# Patient Record
Sex: Male | Born: 1970 | Race: White | Hispanic: No | Marital: Married | Smoking: Current every day smoker
Health system: Southern US, Community
[De-identification: ages and names within clinical notes are randomized; demographics above are authoritative.]

## PROBLEM LIST (undated history)

## (undated) DIAGNOSIS — E785 Hyperlipidemia, unspecified: Secondary | ICD-10-CM

## (undated) DIAGNOSIS — I10 Essential (primary) hypertension: Secondary | ICD-10-CM

## (undated) HISTORY — DX: Hyperlipidemia, unspecified: E78.5

## (undated) HISTORY — DX: Essential (primary) hypertension: I10

---

## 2020-06-04 ENCOUNTER — Emergency Department (HOSPITAL_COMMUNITY): Payer: Self-pay | Admitting: Anesthesiology

## 2020-06-04 ENCOUNTER — Encounter (HOSPITAL_COMMUNITY): Admission: EM | Disposition: A | Discharge status: Home / Self Care | Payer: Self-pay | Source: Home / Self Care

## 2020-06-04 ENCOUNTER — Inpatient Hospital Stay (HOSPITAL_COMMUNITY)
Admission: EM | Admit: 2020-06-04 | Discharge: 2020-06-12 | DRG: 799 | Disposition: A | Discharge status: Home / Self Care | Payer: Self-pay | Attending: Surgery | Admitting: Surgery

## 2020-06-04 ENCOUNTER — Emergency Department (HOSPITAL_COMMUNITY): Payer: Self-pay

## 2020-06-04 ENCOUNTER — Other Ambulatory Visit: Payer: Self-pay

## 2020-06-04 ENCOUNTER — Encounter (HOSPITAL_COMMUNITY): Payer: Self-pay | Admitting: *Deleted

## 2020-06-04 DIAGNOSIS — Z711 Person with feared health complaint in whom no diagnosis is made: Secondary | ICD-10-CM

## 2020-06-04 DIAGNOSIS — S3609XA Other injury of spleen, initial encounter: Principal | ICD-10-CM | POA: Diagnosis present

## 2020-06-04 DIAGNOSIS — I96 Gangrene, not elsewhere classified: Secondary | ICD-10-CM | POA: Diagnosis present

## 2020-06-04 DIAGNOSIS — Z681 Body mass index (BMI) 19 or less, adult: Secondary | ICD-10-CM

## 2020-06-04 DIAGNOSIS — R000 Tachycardia, unspecified: Secondary | ICD-10-CM | POA: Diagnosis present

## 2020-06-04 DIAGNOSIS — W1789XA Other fall from one level to another, initial encounter: Secondary | ICD-10-CM | POA: Diagnosis present

## 2020-06-04 DIAGNOSIS — J449 Chronic obstructive pulmonary disease, unspecified: Secondary | ICD-10-CM | POA: Diagnosis present

## 2020-06-04 DIAGNOSIS — Z978 Presence of other specified devices: Secondary | ICD-10-CM

## 2020-06-04 DIAGNOSIS — R64 Cachexia: Secondary | ICD-10-CM | POA: Diagnosis present

## 2020-06-04 DIAGNOSIS — E872 Acidosis: Secondary | ICD-10-CM | POA: Diagnosis present

## 2020-06-04 DIAGNOSIS — D62 Acute posthemorrhagic anemia: Secondary | ICD-10-CM | POA: Diagnosis present

## 2020-06-04 DIAGNOSIS — K661 Hemoperitoneum: Secondary | ICD-10-CM

## 2020-06-04 DIAGNOSIS — K869 Disease of pancreas, unspecified: Secondary | ICD-10-CM | POA: Diagnosis present

## 2020-06-04 DIAGNOSIS — Z20822 Contact with and (suspected) exposure to covid-19: Secondary | ICD-10-CM | POA: Diagnosis present

## 2020-06-04 DIAGNOSIS — E43 Unspecified severe protein-calorie malnutrition: Secondary | ICD-10-CM | POA: Insufficient documentation

## 2020-06-04 DIAGNOSIS — Z903 Acquired absence of stomach [part of]: Secondary | ICD-10-CM

## 2020-06-04 DIAGNOSIS — J95821 Acute postprocedural respiratory failure: Secondary | ICD-10-CM | POA: Diagnosis not present

## 2020-06-04 DIAGNOSIS — Z9889 Other specified postprocedural states: Secondary | ICD-10-CM

## 2020-06-04 DIAGNOSIS — D72829 Elevated white blood cell count, unspecified: Secondary | ICD-10-CM | POA: Diagnosis present

## 2020-06-04 DIAGNOSIS — R1110 Vomiting, unspecified: Secondary | ICD-10-CM | POA: Diagnosis not present

## 2020-06-04 DIAGNOSIS — F17210 Nicotine dependence, cigarettes, uncomplicated: Secondary | ICD-10-CM | POA: Diagnosis present

## 2020-06-04 DIAGNOSIS — R0902 Hypoxemia: Secondary | ICD-10-CM

## 2020-06-04 DIAGNOSIS — K5900 Constipation, unspecified: Secondary | ICD-10-CM | POA: Diagnosis present

## 2020-06-04 DIAGNOSIS — S36039A Unspecified laceration of spleen, initial encounter: Secondary | ICD-10-CM

## 2020-06-04 DIAGNOSIS — R066 Hiccough: Secondary | ICD-10-CM | POA: Diagnosis not present

## 2020-06-04 HISTORY — PX: SPLENECTOMY, TOTAL: SHX788

## 2020-06-04 LAB — POCT I-STAT, CHEM 8
BUN: 20 mg/dL (ref 6–20)
Calcium, Ion: 1.15 mmol/L (ref 1.15–1.40)
Chloride: 99 mmol/L (ref 98–111)
Creatinine, Ser: 1.1 mg/dL (ref 0.61–1.24)
Glucose, Bld: 160 mg/dL — ABNORMAL HIGH (ref 70–99)
HCT: 27 % — ABNORMAL LOW (ref 39.0–52.0)
Hemoglobin: 9.2 g/dL — ABNORMAL LOW (ref 13.0–17.0)
Potassium: 5.5 mmol/L — ABNORMAL HIGH (ref 3.5–5.1)
Sodium: 134 mmol/L — ABNORMAL LOW (ref 135–145)
TCO2: 24 mmol/L (ref 22–32)

## 2020-06-04 LAB — LACTIC ACID, PLASMA: Lactic Acid, Venous: 7.3 mmol/L (ref 0.5–1.9)

## 2020-06-04 LAB — CBC WITH DIFFERENTIAL/PLATELET
Abs Immature Granulocytes: 0.95 10*3/uL — ABNORMAL HIGH (ref 0.00–0.07)
Abs Immature Granulocytes: 0.63 10*3/uL — ABNORMAL HIGH (ref 0.00–0.07)
Basophils Absolute: 0.1 10*3/uL (ref 0.0–0.1)
Basophils Absolute: 0.1 10*3/uL (ref 0.0–0.1)
Basophils Relative: 1 %
Basophils Relative: 0 %
Eosinophils Absolute: 0 10*3/uL (ref 0.0–0.5)
Eosinophils Absolute: 0 10*3/uL (ref 0.0–0.5)
Eosinophils Relative: 0 %
Eosinophils Relative: 0 %
HCT: 35.3 % — ABNORMAL LOW (ref 39.0–52.0)
HCT: 26.2 % — ABNORMAL LOW (ref 39.0–52.0)
Hemoglobin: 11 g/dL — ABNORMAL LOW (ref 13.0–17.0)
Hemoglobin: 8.3 g/dL — ABNORMAL LOW (ref 13.0–17.0)
Immature Granulocytes: 5 %
Immature Granulocytes: 4 %
Lymphocytes Relative: 9 %
Lymphocytes Relative: 8 %
Lymphs Abs: 1.7 10*3/uL (ref 0.7–4.0)
Lymphs Abs: 1.3 10*3/uL (ref 0.7–4.0)
MCH: 34 pg (ref 26.0–34.0)
MCH: 33.7 pg (ref 26.0–34.0)
MCHC: 31.2 g/dL (ref 30.0–36.0)
MCHC: 31.7 g/dL (ref 30.0–36.0)
MCV: 109 fL — ABNORMAL HIGH (ref 80.0–100.0)
MCV: 106.5 fL — ABNORMAL HIGH (ref 80.0–100.0)
Monocytes Absolute: 1.5 10*3/uL — ABNORMAL HIGH (ref 0.1–1.0)
Monocytes Absolute: 1.5 10*3/uL — ABNORMAL HIGH (ref 0.1–1.0)
Monocytes Relative: 8 %
Monocytes Relative: 9 %
Neutro Abs: 15.3 10*3/uL — ABNORMAL HIGH (ref 1.7–7.7)
Neutro Abs: 12.3 10*3/uL — ABNORMAL HIGH (ref 1.7–7.7)
Neutrophils Relative %: 77 %
Neutrophils Relative %: 79 %
Platelets: 390 10*3/uL (ref 150–400)
Platelets: 365 10*3/uL (ref 150–400)
RBC: 3.24 MIL/uL — ABNORMAL LOW (ref 4.22–5.81)
RBC: 2.46 MIL/uL — ABNORMAL LOW (ref 4.22–5.81)
RDW: 13.1 % (ref 11.5–15.5)
RDW: 13.1 % (ref 11.5–15.5)
WBC: 19.6 10*3/uL — ABNORMAL HIGH (ref 4.0–10.5)
WBC: 15.7 10*3/uL — ABNORMAL HIGH (ref 4.0–10.5)
nRBC: 0 % (ref 0.0–0.2)
nRBC: 0 % (ref 0.0–0.2)

## 2020-06-04 LAB — COMPREHENSIVE METABOLIC PANEL
ALT: 25 U/L (ref 0–44)
AST: 24 U/L (ref 15–41)
Albumin: 2.9 g/dL — ABNORMAL LOW (ref 3.5–5.0)
Alkaline Phosphatase: 87 U/L (ref 38–126)
Anion gap: >20 — ABNORMAL HIGH (ref 5–15)
BUN: 16 mg/dL (ref 6–20)
CO2: 16 mmol/L — ABNORMAL LOW (ref 22–32)
Calcium: 8.9 mg/dL (ref 8.9–10.3)
Chloride: 97 mmol/L — ABNORMAL LOW (ref 98–111)
Creatinine, Ser: 2.13 mg/dL — ABNORMAL HIGH (ref 0.61–1.24)
GFR, Estimated: 37 mL/min — ABNORMAL LOW (ref >60)
Glucose, Bld: 232 mg/dL — ABNORMAL HIGH (ref 70–99)
Potassium: 4 mmol/L (ref 3.5–5.1)
Sodium: 134 mmol/L — ABNORMAL LOW (ref 135–145)
Total Bilirubin: 0.5 mg/dL (ref 0.3–1.2)
Total Protein: 6.7 g/dL (ref 6.5–8.1)

## 2020-06-04 LAB — RESP PANEL BY RT-PCR (FLU A&B, COVID) ARPGX2
Influenza A by PCR: NEGATIVE
Influenza B by PCR: NEGATIVE
SARS Coronavirus 2 by RT PCR: NEGATIVE

## 2020-06-04 LAB — POCT I-STAT EG7
Acid-base deficit: 6 mmol/L — ABNORMAL HIGH (ref 0.0–2.0)
Bicarbonate: 22.4 mmol/L (ref 20.0–28.0)
Calcium, Ion: 1.13 mmol/L — ABNORMAL LOW (ref 1.15–1.40)
HCT: 33 % — ABNORMAL LOW (ref 39.0–52.0)
Hemoglobin: 11.2 g/dL — ABNORMAL LOW (ref 13.0–17.0)
O2 Saturation: 27 %
Potassium: 5 mmol/L (ref 3.5–5.1)
Sodium: 138 mmol/L (ref 135–145)
TCO2: 24 mmol/L (ref 22–32)
pCO2, Ven: 55.6 mmHg (ref 44.0–60.0)
pH, Ven: 7.214 — ABNORMAL LOW (ref 7.250–7.430)
pO2, Ven: 22 mmHg — CL (ref 32.0–45.0)

## 2020-06-04 LAB — PREPARE RBC (CROSSMATCH)

## 2020-06-04 LAB — POC OCCULT BLOOD, ED: Fecal Occult Bld: NEGATIVE

## 2020-06-04 LAB — APTT: aPTT: 24 seconds (ref 24–36)

## 2020-06-04 LAB — PROTIME-INR
INR: 1.3 — ABNORMAL HIGH (ref 0.8–1.2)
Prothrombin Time: 15.3 seconds — ABNORMAL HIGH (ref 11.4–15.2)

## 2020-06-04 SURGERY — SPLENECTOMY
Anesthesia: General | Site: Abdomen

## 2020-06-04 MED ORDER — PROPOFOL 500 MG/50ML IV EMUL
INTRAVENOUS | Status: DC | PRN
  Administered 2020-06-04: 50 ug/kg/min via INTRAVENOUS

## 2020-06-04 MED ORDER — SODIUM CHLORIDE 0.9 % IV SOLN: INTRAVENOUS | Status: DC | PRN

## 2020-06-04 MED ORDER — ONDANSETRON HCL 4 MG/2ML IJ SOLN: 4.0000 mg | Freq: Four times a day (QID) | INTRAMUSCULAR | Status: DC | PRN

## 2020-06-04 MED ORDER — LACTATED RINGERS IV SOLN: INTRAVENOUS | Status: DC | PRN

## 2020-06-04 MED ORDER — FENTANYL CITRATE (PF) 250 MCG/5ML IJ SOLN
INTRAMUSCULAR | Status: AC
  Filled 2020-06-04: qty 5

## 2020-06-04 MED ORDER — LACTATED RINGERS IV BOLUS (SEPSIS)
1000.0000 mL | Freq: Once | INTRAVENOUS | Status: AC
  Administered 2020-06-04: 15:00:00 1000 mL via INTRAVENOUS

## 2020-06-04 MED ORDER — CHLORHEXIDINE GLUCONATE 0.12% ORAL RINSE (MEDLINE KIT)
15.0000 mL | Freq: Two times a day (BID) | OROMUCOSAL | Status: DC
  Administered 2020-06-05: 15 mL via OROMUCOSAL

## 2020-06-04 MED ORDER — PROCHLORPERAZINE MALEATE 10 MG PO TABS
10.0000 mg | ORAL_TABLET | Freq: Four times a day (QID) | ORAL | Status: DC | PRN
  Filled 2020-06-04: qty 1

## 2020-06-04 MED ORDER — MIDAZOLAM HCL 2 MG/2ML IJ SOLN: 2.0000 mg | INTRAMUSCULAR | Status: DC | PRN

## 2020-06-04 MED ORDER — CEFAZOLIN SODIUM-DEXTROSE 2-3 GM-%(50ML) IV SOLR
INTRAVENOUS | Status: DC | PRN
  Administered 2020-06-04: 2 g via INTRAVENOUS

## 2020-06-04 MED ORDER — FENTANYL CITRATE (PF) 100 MCG/2ML IJ SOLN
50.0000 ug | INTRAMUSCULAR | Status: DC | PRN
  Administered 2020-06-05 (×2): 50 ug via INTRAVENOUS
  Filled 2020-06-04 (×2): qty 2

## 2020-06-04 MED ORDER — PIPERACILLIN-TAZOBACTAM 3.375 G IVPB 30 MIN
3.3750 g | Freq: Once | INTRAVENOUS | Status: AC
  Administered 2020-06-04: 15:00:00 3.375 g via INTRAVENOUS
  Filled 2020-06-04: qty 50

## 2020-06-04 MED ORDER — FENTANYL CITRATE (PF) 100 MCG/2ML IJ SOLN: 50.0000 ug | INTRAMUSCULAR | Status: DC | PRN

## 2020-06-04 MED ORDER — MIDAZOLAM HCL 2 MG/2ML IJ SOLN
INTRAMUSCULAR | Status: AC
  Filled 2020-06-04: qty 2

## 2020-06-04 MED ORDER — PROCHLORPERAZINE EDISYLATE 10 MG/2ML IJ SOLN
5.0000 mg | Freq: Four times a day (QID) | INTRAMUSCULAR | Status: DC | PRN
  Filled 2020-06-04: qty 2

## 2020-06-04 MED ORDER — ONDANSETRON HCL 4 MG/2ML IJ SOLN
INTRAMUSCULAR | Status: DC | PRN
  Administered 2020-06-04: 4 mg via INTRAVENOUS

## 2020-06-04 MED ORDER — LACTATED RINGERS IV SOLN: INTRAVENOUS | Status: DC

## 2020-06-04 MED ORDER — SODIUM BICARBONATE 8.4 % IV SOLN
INTRAVENOUS | Status: DC | PRN
  Administered 2020-06-04: 50 meq via INTRAVENOUS

## 2020-06-04 MED ORDER — PIPERACILLIN-TAZOBACTAM 3.375 G IVPB
3.3750 g | Freq: Three times a day (TID) | INTRAVENOUS | Status: DC
  Administered 2020-06-05: 05:00:00 3.375 g via INTRAVENOUS
  Filled 2020-06-04: qty 50

## 2020-06-04 MED ORDER — ALBUMIN HUMAN 5 % IV SOLN: INTRAVENOUS | Status: DC | PRN

## 2020-06-04 MED ORDER — ROCURONIUM BROMIDE 10 MG/ML (PF) SYRINGE
PREFILLED_SYRINGE | INTRAVENOUS | Status: DC | PRN
  Administered 2020-06-04 (×2): 10 mg via INTRAVENOUS
  Administered 2020-06-04: 20 mg via INTRAVENOUS
  Administered 2020-06-04: 40 mg via INTRAVENOUS
  Administered 2020-06-04: 10 mg via INTRAVENOUS

## 2020-06-04 MED ORDER — SODIUM CHLORIDE 0.9 % IV SOLN: 2.0000 g | INTRAVENOUS | Status: DC

## 2020-06-04 MED ORDER — IOHEXOL 300 MG/ML  SOLN
75.0000 mL | Freq: Once | INTRAMUSCULAR | Status: AC | PRN
  Administered 2020-06-04: 16:00:00 75 mL via INTRAVENOUS

## 2020-06-04 MED ORDER — FENTANYL CITRATE (PF) 100 MCG/2ML IJ SOLN
50.0000 ug | Freq: Once | INTRAMUSCULAR | Status: AC
  Administered 2020-06-04: 17:00:00 50 ug via INTRAVENOUS
  Filled 2020-06-04: qty 2

## 2020-06-04 MED ORDER — FENTANYL CITRATE (PF) 100 MCG/2ML IJ SOLN
INTRAMUSCULAR | Status: DC | PRN
  Administered 2020-06-04: 25 ug via INTRAVENOUS
  Administered 2020-06-04: 50 ug via INTRAVENOUS
  Administered 2020-06-04: 100 ug via INTRAVENOUS
  Administered 2020-06-04: 50 ug via INTRAVENOUS
  Administered 2020-06-04 (×3): 25 ug via INTRAVENOUS

## 2020-06-04 MED ORDER — SODIUM CHLORIDE 0.9 % IV SOLN: 250.0000 mL | INTRAVENOUS | Status: DC

## 2020-06-04 MED ORDER — LIDOCAINE 2% (20 MG/ML) 5 ML SYRINGE: INTRAMUSCULAR | Status: DC | PRN

## 2020-06-04 MED ORDER — PHENYLEPHRINE HCL-NACL 10-0.9 MG/250ML-% IV SOLN
INTRAVENOUS | Status: DC | PRN
  Administered 2020-06-04: 50 ug/min via INTRAVENOUS

## 2020-06-04 MED ORDER — ETOMIDATE 2 MG/ML IV SOLN
INTRAVENOUS | Status: DC | PRN
  Administered 2020-06-04: 12 mg via INTRAVENOUS

## 2020-06-04 MED ORDER — 0.9 % SODIUM CHLORIDE (POUR BTL) OPTIME
TOPICAL | Status: DC | PRN
  Administered 2020-06-04: 20:00:00 1000 mL

## 2020-06-04 MED ORDER — ORAL CARE MOUTH RINSE: 15.0000 mL | OROMUCOSAL | Status: DC

## 2020-06-04 MED ORDER — METRONIDAZOLE IN NACL 5-0.79 MG/ML-% IV SOLN
500.0000 mg | INTRAVENOUS | Status: AC
  Administered 2020-06-04: 20:00:00 500 mg via INTRAVENOUS
  Filled 2020-06-04: qty 100

## 2020-06-04 MED ORDER — ONDANSETRON HCL 4 MG/2ML IJ SOLN
4.0000 mg | Freq: Once | INTRAMUSCULAR | Status: AC
  Administered 2020-06-04: 15:00:00 4 mg via INTRAVENOUS
  Filled 2020-06-04: qty 2

## 2020-06-04 MED ORDER — LACTATED RINGERS IV BOLUS (SEPSIS)
1000.0000 mL | Freq: Once | INTRAVENOUS | Status: AC
Start: 2020-06-04 — End: 2020-06-04
  Administered 2020-06-04: 16:00:00 1000 mL via INTRAVENOUS

## 2020-06-04 MED ORDER — SUCCINYLCHOLINE CHLORIDE 20 MG/ML IJ SOLN
INTRAMUSCULAR | Status: DC | PRN
  Administered 2020-06-04: 140 mg via INTRAVENOUS

## 2020-06-04 MED ORDER — PROPOFOL 1000 MG/100ML IV EMUL
0.0000 ug/kg/min | INTRAVENOUS | Status: DC
  Administered 2020-06-04: 22:00:00 25 ug/kg/min via INTRAVENOUS
  Administered 2020-06-05: 04:00:00 40 ug/kg/min via INTRAVENOUS
  Filled 2020-06-04: qty 100

## 2020-06-04 MED ORDER — CALCIUM CHLORIDE 10 % IV SOLN
INTRAVENOUS | Status: DC | PRN
  Administered 2020-06-04: 200 mg via INTRAVENOUS
  Administered 2020-06-04: 100 mg via INTRAVENOUS

## 2020-06-04 MED ORDER — MIDAZOLAM HCL 5 MG/5ML IJ SOLN
INTRAMUSCULAR | Status: DC | PRN
  Administered 2020-06-04 (×2): .5 mg via INTRAVENOUS
  Administered 2020-06-04: 1 mg via INTRAVENOUS

## 2020-06-04 MED ORDER — ONDANSETRON 4 MG PO TBDP
4.0000 mg | ORAL_TABLET | Freq: Four times a day (QID) | ORAL | Status: DC | PRN
  Filled 2020-06-04: qty 1

## 2020-06-04 MED ORDER — PHENYLEPHRINE HCL-NACL 10-0.9 MG/250ML-% IV SOLN
25.0000 ug/min | INTRAVENOUS | Status: DC
  Administered 2020-06-04: 22:00:00 75 ug/min via INTRAVENOUS
  Administered 2020-06-05: 04:00:00 65 ug/min via INTRAVENOUS
  Administered 2020-06-05: 02:00:00 50 ug/min via INTRAVENOUS
  Filled 2020-06-04 (×3): qty 250

## 2020-06-04 MED ORDER — DEXAMETHASONE SODIUM PHOSPHATE 10 MG/ML IJ SOLN
INTRAMUSCULAR | Status: DC | PRN
  Administered 2020-06-04: 5 mg via INTRAVENOUS

## 2020-06-04 MED ORDER — CHLORHEXIDINE GLUCONATE CLOTH 2 % EX PADS
6.0000 | MEDICATED_PAD | Freq: Every day | CUTANEOUS | Status: DC
  Administered 2020-06-06 – 2020-06-12 (×7): 6 via TOPICAL

## 2020-06-04 SURGICAL SUPPLY — 55 items
APPLIER CLIP 11 MED OPEN (CLIP) ×2
BIOPATCH RED 1 DISK 7.0 (GAUZE/BANDAGES/DRESSINGS) ×4 IMPLANT
CHLORAPREP W/TINT 26 (MISCELLANEOUS) ×2 IMPLANT
CLIP APPLIE 11 MED OPEN (CLIP) ×1 IMPLANT
COVER SURGICAL LIGHT HANDLE (MISCELLANEOUS) ×2 IMPLANT
DRAIN CHANNEL 19F RND (DRAIN) ×2 IMPLANT
DRAIN PENROSE 0.5X18 (DRAIN) ×2 IMPLANT
DRAPE LAPAROSCOPIC ABDOMINAL (DRAPES) ×2 IMPLANT
DRSG OPSITE POSTOP 4X12 (GAUZE/BANDAGES/DRESSINGS) ×4 IMPLANT
ELECT BLADE 6.5 EXT (BLADE) ×2 IMPLANT
ELECT REM PT RETURN 9FT ADLT (ELECTROSURGICAL) ×2
ELECTRODE REM PT RTRN 9FT ADLT (ELECTROSURGICAL) ×1 IMPLANT
EVACUATOR SILICONE 100CC (DRAIN) ×2 IMPLANT
GAUZE SPONGE 4X4 12PLY STRL LF (GAUZE/BANDAGES/DRESSINGS) ×2 IMPLANT
GLOVE BIO SURGEON STRL SZ7 (GLOVE) ×2 IMPLANT
GLOVE BIO SURGEON STRL SZ8 (GLOVE) ×4 IMPLANT
GLOVE BIOGEL PI IND STRL 8 (GLOVE) ×2 IMPLANT
GLOVE BIOGEL PI INDICATOR 8 (GLOVE) ×2
GOWN STRL REUS W/ TWL LRG LVL3 (GOWN DISPOSABLE) ×2 IMPLANT
GOWN STRL REUS W/ TWL XL LVL3 (GOWN DISPOSABLE) ×1 IMPLANT
GOWN STRL REUS W/TWL LRG LVL3 (GOWN DISPOSABLE) ×2
GOWN STRL REUS W/TWL XL LVL3 (GOWN DISPOSABLE) ×1
HANDLE SUCTION POOLE (INSTRUMENTS) ×1 IMPLANT
KIT BASIN OR (CUSTOM PROCEDURE TRAY) ×2 IMPLANT
KIT TURNOVER KIT B (KITS) ×2 IMPLANT
LIGASURE IMPACT 36 18CM CVD LR (INSTRUMENTS) ×2 IMPLANT
NS IRRIG 1000ML POUR BTL (IV SOLUTION) ×4 IMPLANT
PACK GENERAL/GYN (CUSTOM PROCEDURE TRAY) ×2 IMPLANT
PAD ARMBOARD 7.5X6 YLW CONV (MISCELLANEOUS) ×4 IMPLANT
PENCIL SMOKE EVACUATOR (MISCELLANEOUS) ×2 IMPLANT
RELOAD STAPLER BLUE 60MM (STAPLE) ×2 IMPLANT
RELOAD STAPLER GREEN 60MM (STAPLE) ×7 IMPLANT
RELOAD STAPLER WHITE 60MM (STAPLE) ×1 IMPLANT
SLEEVE SURGEON STRL (DRAPES) ×2 IMPLANT
SPONGE LAP 18X18 RF (DISPOSABLE) ×12 IMPLANT
STAPLE ECHEON FLEX 60 POW ENDO (STAPLE) ×2 IMPLANT
STAPLER RELOAD BLUE 60MM (STAPLE) ×4
STAPLER RELOAD GREEN 60MM (STAPLE) ×14
STAPLER RELOAD WHITE 60MM (STAPLE) ×2
STAPLER VISISTAT 35W (STAPLE) ×2 IMPLANT
SUCTION POOLE HANDLE (INSTRUMENTS) ×2
SUT ETHILON 2 0 FS 18 (SUTURE) ×2 IMPLANT
SUT PDS AB 1 TP1 96 (SUTURE) ×4 IMPLANT
SUT PDS AB 2-0 CT1 27 (SUTURE) ×2 IMPLANT
SUT SILK 0 TIES 10X30 (SUTURE) ×2 IMPLANT
SUT SILK 2 0 SH CR/8 (SUTURE) ×4 IMPLANT
SUT SILK 2 0 TIES 10X30 (SUTURE) ×2 IMPLANT
SUT SILK 3 0 SH CR/8 (SUTURE) ×2 IMPLANT
SUT VIC AB 2-0 SH 18 (SUTURE) ×4 IMPLANT
SYR CONTROL 10ML LL (SYRINGE) ×2 IMPLANT
TOWEL GREEN STERILE (TOWEL DISPOSABLE) ×2 IMPLANT
TOWEL GREEN STERILE FF (TOWEL DISPOSABLE) ×2 IMPLANT
TRAY FOL W/BAG SLVR 16FR STRL (SET/KITS/TRAYS/PACK) ×1 IMPLANT
TRAY FOLEY W/BAG SLVR 16FR LF (SET/KITS/TRAYS/PACK) ×1
TUBE MIC GASTROSTOMY 18FR 7-10 (TUBING) ×2 IMPLANT

## 2020-06-04 NOTE — ED Notes (Signed)
Pt gone to CT 

## 2020-06-04 NOTE — ED Provider Notes (Signed)
Medical screening examination/treatment/procedure(s) were conducted as a shared visit with non-physician practitioner(s) and myself.  I personally evaluated the patient during the encounter.  Clinical Impression:   Final diagnoses:  Intraperitoneal hemorrhage  Laceration of spleen, initial encounter    This patient is an ill-appearing 49 year old male presenting after having acute onset of abdominal pain yesterday morning.  He does recall having a fall a couple of days ago the exact details of which are somewhat cloudy however he states that the dog ran under his feet when he was coming down the stairs he tripped and fell thinks that he landed on his right side.  On exam the patient is a peritoneal abdomen, he is guarding diffusely, he has mild tachycardia, mild hypotension, his x-ray of his lungs show that he has a large bleb in the left side encompassing more than a third of his left thoracic cavity, he is a chronic smoker.  His CT scan of abdomen pelvis which I personally viewed shows free fluid, after discussion with the radiologist it appears that there is a large splenic injury or rupture with free abdominal blood.  I discussed the case with Dr. Franky Macho our local surgeon who agrees that this patient may need more blood transfusions or stabilizing care and with his lung disease may need further expertise that may be better obtained at a larger trauma center.  Will discuss with the trauma surgeons.  This patient is critically ill.  Will repeat hemoglobin.  Start 2 units of blood, discussed with the trauma surgeon at [facility] who accepts the patient in transfer.  The patient has a blood pressure that is between 80 and 90 systolic, pulse is around 107, his mental status is normal.  I discussed with [facility] for the transfer service and they will send a truck immediately.  This patient will go ER to ER and then likely to the operating room     Eber Hong, MD 06/06/20 1558

## 2020-06-04 NOTE — ED Notes (Signed)
Attempted report x1. 

## 2020-06-04 NOTE — ED Notes (Signed)
Report attempted to charge nurse, states will call us back for report. Informed [facility] enroute to get patient, again states will call back.

## 2020-06-04 NOTE — Transfer of Care (Signed)
Immediate Anesthesia Transfer of Care Note  Patient: Eddie Morrow  Procedure(s) Performed: SPLENECTOMY, PARTIAL GASTRECTOMY, DISTAL PANCREATECTOMY, GASTRO-JEJUNO TUBE PLACEMENT (N/A Abdomen)  Patient Location: ICU  Anesthesia Type:General  Level of Consciousness: sedated and Patient remains intubated per anesthesia plan  Airway & Oxygen Therapy: Patient remains intubated per anesthesia plan and Patient placed on Ventilator (see vital sign flow sheet for setting)  Post-op Assessment: Report given to RN and Post -op Vital signs reviewed and stable  Post vital signs: Reviewed and stable  Last Vitals:  Vitals Value Taken Time  BP 112/85 06/04/20 2317  Temp    Pulse 112 06/04/20 2326  Resp 18 06/04/20 2326  SpO2 100 % 06/04/20 2326  Vitals shown include unvalidated device data.  Last Pain:  Vitals:   06/04/20 1935  TempSrc:   PainSc: 0-No pain         Complications: No complications documented.

## 2020-06-04 NOTE — Anesthesia Procedure Notes (Signed)
Procedure Name: Intubation Date/Time: 06/04/2020 7:56 PM Performed by: Edmonia Caprio, CRNA Pre-anesthesia Checklist: Patient identified, Emergency Drugs available, Suction available, Patient being monitored and Timeout performed Patient Re-evaluated:Patient Re-evaluated prior to induction Oxygen Delivery Method: Circle system utilized Preoxygenation: Pre-oxygenation with 100% oxygen Induction Type: IV induction, Rapid sequence and Cricoid Pressure applied Laryngoscope Size: Glidescope and 3 Grade View: Grade I Tube type: Oral Tube size: 7.5 mm Number of attempts: 1 Airway Equipment and Method: Stylet and Video-laryngoscopy Placement Confirmation: ETT inserted through vocal cords under direct vision,  positive ETCO2 and breath sounds checked- equal and bilateral Secured at: 23 cm Tube secured with: Tape Dental Injury: Teeth and Oropharynx as per pre-operative assessment  Comments: Oropharynx clear on DL.  Teeth/lips unchanged

## 2020-06-04 NOTE — H&P (Signed)
Admitting Physician: Hyman Hopes Dessire Grimes  Service: General surgery  CC: Abdominal pain  Subjective   HPI: Eddie Morrow is an 49 y.o. male who is here for abdominal pain.  He was transferred from [facility] after being found to have a ruptured spleen.  Since Thursday he has had abdominal discomfort that he thought was constipation and has been taking some Gas-X for it.  He was able to have a bowel movement but the pain can continued.  Yesterday morning the pain became severe and has progressed to the point that he presented to the hospital.  Notably a few weeks ago he did fall from 8 feet and had some bruising on his right side.  He did not have any bruising on his left side at that time.  He has had not had any trauma or falls since then.  History reviewed. No pertinent past medical history.  History reviewed. No pertinent surgical history.  History reviewed. No pertinent family history.  Social:  reports that he has been smoking cigarettes. He has never used smokeless tobacco. He reports current alcohol use. He reports current drug use. Drug: Marijuana.  Allergies: No Known Allergies  Medications: No current outpatient medications  ROS - all of the below systems have been reviewed with the patient and positives are indicated with bold text General: chills, fever or night sweats Eyes: blurry vision or double vision ENT: epistaxis or sore throat Allergy/Immunology: itchy/watery eyes or nasal congestion Hematologic/Lymphatic: bleeding problems, blood clots or swollen lymph nodes Endocrine: temperature intolerance or unexpected weight changes Breast: new or changing breast lumps or nipple discharge Resp: cough, shortness of breath-says this is from long-term smoking, or wheezing CV: chest pain or dyspnea on exertion GI: as per HPI GU: dysuria, trouble voiding, or hematuria MSK: joint pain or joint stiffness Neuro: TIA or stroke symptoms Derm: pruritus and skin lesion  changes Psych: anxiety and depression  Objective   PE Blood pressure 102/80, pulse (!) 118, temperature 98.3 F (36.8 C), temperature source Oral, resp. rate 18, height 5\' 10"  (1.778 m), weight 59 kg, SpO2 92 %. Constitutional: NAD; conversant; no deformities Eyes: Moist conjunctiva; no lid lag; anicteric; PERRL Neck: Trachea midline; no thyromegaly Lungs: Normal respiratory effort; no tactile fremitus CV: Tachycardic, RRR; no palpable thrills; no pitting edema, blood pressures responded to resuscitation GI: Abd rigid abdomen with guarding on exam; no palpable hepatosplenomegaly MSK: Normal range of motion of extremities; no clubbing/cyanosis Psychiatric: Appropriate affect; alert and oriented x3 Lymphatic: No palpable cervical or axillary lymphadenopathy  Results for orders placed or performed during the hospital encounter of 06/04/20 (from the past 24 hour(s))  Culture, blood (x 2)     Status: None (Preliminary result)   Collection Time: 06/04/20  2:20 PM   Specimen: Left Antecubital; Blood  Result Value Ref Range   Specimen Description      LEFT ANTECUBITAL BOTTLES DRAWN AEROBIC AND ANAEROBIC   Special Requests      Blood Culture results may not be optimal due to an inadequate volume of blood received in culture bottles Performed at [facility], [address] [place], [place]    Culture PENDING    Report Status PENDING   Culture, blood (x 2)     Status: None (Preliminary result)   Collection Time: 06/04/20  2:20 PM   Specimen: Right Antecubital; Blood  Result Value Ref Range   Specimen Description      RIGHT ANTECUBITAL BOTTLES DRAWN AEROBIC AND ANAEROBIC  Special Requests      Blood Culture results may not be optimal due to an inadequate volume of blood received in culture bottles Performed at [facility], [address], [place], [place] [address]    Culture PENDING    Report Status PENDING   CBC with Differential     Status: Abnormal   Collection Time:  06/04/20  2:20 PM  Result Value Ref Range   WBC 19.6 (H) 4.0 - 10.5 K/uL   RBC 3.24 (L) 4.22 - 5.81 MIL/uL   Hemoglobin 11.0 (L) 13.0 - 17.0 g/dL   HCT 25.4 (L) 27.0 - 62.3 %   MCV 109.0 (H) 80.0 - 100.0 fL   MCH 34.0 26.0 - 34.0 pg   MCHC 31.2 30.0 - 36.0 g/dL   RDW 76.2 83.1 - 51.7 %   Platelets 390 150 - 400 K/uL   nRBC 0.0 0.0 - 0.2 %   Neutrophils Relative % 77 %   Neutro Abs 15.3 (H) 1.7 - 7.7 K/uL   Lymphocytes Relative 9 %   Lymphs Abs 1.7 0.7 - 4.0 K/uL   Monocytes Relative 8 %   Monocytes Absolute 1.5 (H) 0.1 - 1.0 K/uL   Eosinophils Relative 0 %   Eosinophils Absolute 0.0 0.0 - 0.5 K/uL   Basophils Relative 1 %   Basophils Absolute 0.1 0.0 - 0.1 K/uL   Immature Granulocytes 5 %   Abs Immature Granulocytes 0.95 (H) 0.00 - 0.07 K/uL  Comprehensive metabolic panel     Status: Abnormal   Collection Time: 06/04/20  2:20 PM  Result Value Ref Range   Sodium 134 (L) 135 - 145 mmol/L   Potassium 4.0 3.5 - 5.1 mmol/L   Chloride 97 (L) 98 - 111 mmol/L   CO2 16 (L) 22 - 32 mmol/L   Glucose, Bld 232 (H) 70 - 99 mg/dL   BUN 16 6 - 20 mg/dL   Creatinine, Ser 6.16 (H) 0.61 - 1.24 mg/dL   Calcium 8.9 8.9 - 07.3 mg/dL   Total Protein 6.7 6.5 - 8.1 g/dL   Albumin 2.9 (L) 3.5 - 5.0 g/dL   AST 24 15 - 41 U/L   ALT 25 0 - 44 U/L   Alkaline Phosphatase 87 38 - 126 U/L   Total Bilirubin 0.5 0.3 - 1.2 mg/dL   GFR, Estimated 37 (L) >60 mL/min   Anion gap >20 (H) 5 - 15  Protime-INR     Status: Abnormal   Collection Time: 06/04/20  2:20 PM  Result Value Ref Range   Prothrombin Time 15.3 (H) 11.4 - 15.2 seconds   INR 1.3 (H) 0.8 - 1.2  APTT     Status: None   Collection Time: 06/04/20  2:20 PM  Result Value Ref Range   aPTT 24 24 - 36 seconds  POC occult blood, ED Provider will collect     Status: None   Collection Time: 06/04/20  3:10 PM  Result Value Ref Range   Fecal Occult Bld NEGATIVE NEGATIVE  Resp Panel by RT-PCR (Flu A&B, Covid) Nasopharyngeal Swab     Status: None    Collection Time: 06/04/20  3:39 PM   Specimen: Nasopharyngeal Swab; Nasopharyngeal(NP) swabs in vial transport medium  Result Value Ref Range   SARS Coronavirus 2 by RT PCR NEGATIVE NEGATIVE   Influenza A by PCR NEGATIVE NEGATIVE   Influenza B by PCR NEGATIVE NEGATIVE  Type and screen     Status: None (Preliminary result)   Collection Time:  06/04/20  5:12 PM  Result Value Ref Range   ABO/RH(D) PENDING    Antibody Screen PENDING    Sample Expiration 06/07/2020,2359    Unit Number [id number]    Blood Component Type RED CELLS,LR    Unit division 00    Status of Unit ISSUED    Unit tag comment EMERGENCY RELEASE    Transfusion Status OK TO TRANSFUSE    Crossmatch Result PENDING    Unit Number [id number]    Blood Component Type RED CELLS,LR    Unit division 00    Status of Unit ISSUED    Unit tag comment EMERGENCY RELEASE    Transfusion Status      OK TO TRANSFUSE Performed at [facility], [address], [place], [place] [address]    Crossmatch Result PENDING   Lactic acid, plasma     Status: Abnormal   Collection Time: 06/04/20  5:25 PM  Result Value Ref Range   Lactic Acid, Venous 7.3 (HH) 0.5 - 1.9 mmol/L  CBC with Differential/Platelet     Status: Abnormal   Collection Time: 06/04/20  5:25 PM  Result Value Ref Range   WBC 15.7 (H) 4.0 - 10.5 K/uL   RBC 2.46 (L) 4.22 - 5.81 MIL/uL   Hemoglobin 8.3 (L) 13.0 - 17.0 g/dL   HCT 10.2 (L) 58.5 - 27.7 %   MCV 106.5 (H) 80.0 - 100.0 fL   MCH 33.7 26.0 - 34.0 pg   MCHC 31.7 30.0 - 36.0 g/dL   RDW 82.4 23.5 - 36.1 %   Platelets 365 150 - 400 K/uL   nRBC 0.0 0.0 - 0.2 %   Neutrophils Relative % 79 %   Neutro Abs 12.3 (H) 1.7 - 7.7 K/uL   Lymphocytes Relative 8 %   Lymphs Abs 1.3 0.7 - 4.0 K/uL   Monocytes Relative 9 %   Monocytes Absolute 1.5 (H) 0.1 - 1.0 K/uL   Eosinophils Relative 0 %   Eosinophils Absolute 0.0 0.0 - 0.5 K/uL   Basophils Relative 0 %   Basophils Absolute 0.1 0.0 - 0.1 K/uL   Immature Granulocytes 4  %   Abs Immature Granulocytes 0.63 (H) 0.00 - 0.07 K/uL     Imaging Orders     DG ABD ACUTE 2+V W 1V CHEST     CT ABDOMEN PELVIS W CONTRAST   Assessment and Plan   Eddie Morrow is an 49 y.o. 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    I recommended proceeding with open splenectomy.  I discussed the pancreatic mass with the patient.  I explained that I may decide to ignore the mass, biopsy the mass, or remove the distal pancreas during the surgery depending on the appearance intraoperatively.  I explained he may require additional surgery for his pancreas in the future.  The procedure itself, as well as its risk, benefits and alternatives were discussed the patient.  After full discussion all questions answered the patient granted consent to proceed.  We will proceed to the operating room emergently.   Ivar Drape, M.D. General, Bariatric and Minimally Invasive Surgery  [facility] Use [email or web address] to contact on call provider

## 2020-06-04 NOTE — ED Triage Notes (Signed)
Pt with abd pain , constipation for past 2 days.  Tried ex lax yesterday and had some gas, took medication for the gas.  Chills and hot flashes with emesis.

## 2020-06-04 NOTE — Anesthesia Preprocedure Evaluation (Addendum)
Anesthesia Evaluation  Patient identified by MRN, date of birth, ID band Patient awake    Reviewed: Allergy & Precautions, NPO status , Patient's Chart, lab work & pertinent test results  Airway Mallampati: II  TM Distance: >3 FB     Dental  (+) Poor Dentition, Dental Advisory Given   Pulmonary Current Smoker,    breath sounds clear to auscultation       Cardiovascular negative cardio ROS   Rhythm:Regular Rate:Normal     Neuro/Psych    GI/Hepatic History noted CG History noted. CG   Endo/Other  negative endocrine ROS  Renal/GU negative Renal ROS     Musculoskeletal   Abdominal   Peds  Hematology   Anesthesia Other Findings   Reproductive/Obstetrics                            Anesthesia Physical Anesthesia Plan  ASA: I and emergent  Anesthesia Plan: General   Post-op Pain Management:    Induction: Intravenous  PONV Risk Score and Plan: 2 and Ondansetron, Dexamethasone and Midazolam  Airway Management Planned: Oral ETT  Additional Equipment:   Intra-op Plan:   Post-operative Plan: Possible Post-op intubation/ventilation  Informed Consent: I have reviewed the patients History and Physical, chart, labs and discussed the procedure including the risks, benefits and alternatives for the proposed anesthesia with the patient or authorized representative who has indicated his/her understanding and acceptance.     Dental advisory given  Plan Discussed with: CRNA, Anesthesiologist and Surgeon  Anesthesia Plan Comments:        Anesthesia Quick Evaluation

## 2020-06-04 NOTE — ED Provider Notes (Addendum)
[facility] EMERGENCY DEPARTMENT Provider Note   CSN: [id number] Arrival date & time: 06/04/20  1345     History Chief Complaint  Patient presents with  . Abdominal Pain    Eddie Morrow is a 49 y.o. male.  The history is provided by the patient. No language interpreter was used.  Abdominal Pain    49 year old male presenting to the ED for evaluation of abdominal pain.  Patient report for the past 2 to 3 days he has had persistent abdominal pain.  Pain is intense according to patient, 10 out of 10, he also endorsed feeling constipated, and feeling nauseous.  Last bowel movement was about 2 days ago.  He does endorse some lightheadedness and dizzy but denies any fever chest pain or coughing.  He does endorse some shortness of breath.  He is a social drinker and smoke approximately half a pack a day.  He endorsed occasional marijuana use.  He denies any prior abdominal surgeries.  He has not noticed any abnormal bleeding.  Last ate earlier today.  No recent sick contact.  He has not been vaccinated for COVID-19.    History reviewed. No pertinent past medical history.  There are no problems to display for this patient.   History reviewed. No pertinent surgical history.     History reviewed. No pertinent family history.  Social History   Tobacco Use  . Smoking status: Current Every Day Smoker    Types: Cigarettes  . Smokeless tobacco: Never Used  Substance Use Topics  . Alcohol use: Yes  . Drug use: Yes    Types: Marijuana    Home Medications Prior to Admission medications   Not on File    Allergies    Patient has no known allergies.  Review of Systems   Review of Systems  Gastrointestinal: Positive for abdominal pain.  All other systems reviewed and are negative.   Physical Exam Updated Vital Signs BP (!) 77/60 (BP Location: Right Arm)   Pulse (!) 131   Resp 14   Ht  (1.778 m)   Wt 59 kg   SpO2 99%   BMI 18.65 kg/m   Physical Exam Vitals  and nursing note reviewed. Exam conducted with a chaperone present.  Constitutional:      General: He is in acute distress.     Appearance: He is well-nourished. He is ill-appearing and diaphoretic.     Comments: Patient appears older than stated age.  He appears uncomfortable, hunched over, actively vomiting  HENT:     Head: Atraumatic.  Eyes:     Conjunctiva/sclera: Conjunctivae normal.  Cardiovascular:     Rate and Rhythm: Tachycardia present.     Heart sounds: Normal heart sounds.  Pulmonary:     Breath sounds: No wheezing, rhonchi or rales.  Abdominal:     General: Bowel sounds are normal. There is distension.     Tenderness: There is generalized abdominal tenderness. There is guarding. There is no rebound.  Genitourinary:    Comments: Heather, RN was available to chaperone.  Patient has normal rectal tone, no obvious mass, no stool impaction, normal color stool on glove, Hemoccult negative. Musculoskeletal:     Cervical back: Neck supple.  Skin:    Coloration: Skin is pale.     Findings: No rash.  Neurological:     Mental Status: He is alert and oriented to person, place, and time.  Psychiatric:        Mood and Affect: Mood is anxious.  ED Results / Procedures / Treatments   Labs (all labs ordered are listed, but only abnormal results are displayed) Labs Reviewed  CBC WITH DIFFERENTIAL/PLATELET - Abnormal; Notable for the following components:      Result Value   WBC 19.6 (*)    RBC 3.24 (*)    Hemoglobin 11.0 (*)    HCT 35.3 (*)    MCV 109.0 (*)    Neutro Abs 15.3 (*)    Monocytes Absolute 1.5 (*)    Abs Immature Granulocytes 0.95 (*)    All other components within normal limits  COMPREHENSIVE METABOLIC PANEL - Abnormal; Notable for the following components:   Sodium 134 (*)    Chloride 97 (*)    CO2 16 (*)    Glucose, Bld 232 (*)    Creatinine, Ser 2.13 (*)    Albumin 2.9 (*)    GFR, Estimated 37 (*)    Anion gap >20 (*)    All other components within  normal limits  PROTIME-INR - Abnormal; Notable for the following components:   Prothrombin Time 15.3 (*)    INR 1.3 (*)    All other components within normal limits  CBC WITH DIFFERENTIAL/PLATELET - Abnormal; Notable for the following components:   WBC 15.7 (*)    RBC 2.46 (*)    Hemoglobin 8.3 (*)    HCT 26.2 (*)    MCV 106.5 (*)    Neutro Abs 12.3 (*)    Monocytes Absolute 1.5 (*)    Abs Immature Granulocytes 0.63 (*)    All other components within normal limits  CULTURE, BLOOD (ROUTINE X 2)  CULTURE, BLOOD (ROUTINE X 2)  RESP PANEL BY RT-PCR (FLU A&B, COVID) ARPGX2  APTT  LACTIC ACID, PLASMA  URINALYSIS, ROUTINE W REFLEX MICROSCOPIC  LACTIC ACID, PLASMA  POC OCCULT BLOOD, ED  TYPE AND SCREEN    EKG EKG Interpretation  Date/Time:  Saturday June 04 2020 14:43:46 EST Ventricular Rate:  103 PR Interval:    QRS Duration: 73 QT Interval:  362 QTC Calculation: 474 R Axis:   48 Text Interpretation: Sinus tachycardia Abnormal R-wave progression, early transition Minimal ST depression, diffuse leads No old tracing to compare Confirmed by Meridee ScoreButler, Kanaan [phone]) on 06/04/2020 2:49:24 PM   Radiology CT ABDOMEN PELVIS W CONTRAST  Result Date: 06/04/2020 CLINICAL DATA:  Abdominal pain. EXAM: CT ABDOMEN AND PELVIS WITH CONTRAST TECHNIQUE: Multidetector CT imaging of the abdomen and pelvis was performed using the standard protocol following bolus administration of intravenous contrast. CONTRAST:  75mL OMNIPAQUE IOHEXOL 300 MG/ML  SOLN COMPARISON:  None. FINDINGS: Lower chest: Advanced emphysematous changes are noted. The heart is normal in size. Coronary artery calcifications are also noted. Hepatobiliary: No hepatic lesions or intrahepatic biliary dilatation. The gallbladder appears normal. No common bile duct dilatation. Pancreas: There is a 18 x 16 mm low-attenuation lesion in the pancreas at the body tail junction region. No pancreatic ductal dilatation. Spleen: There is a large  splenic hematoma likely due to a laceration and a large intra splenic subcapsular hematoma along with a large amount hemoperitoneum extending down into the pelvis. Adrenals/Urinary Tract: The adrenal glands and kidneys are unremarkable. The bladder is unremarkable. Stomach/Bowel: There is fairly significant mass effect on the stomach by the large splenic hematoma. There also nodular areas of low attenuation involving the posterior gastric wall worrisome for tumor. The duodenum, small bowel and colon are unremarkable. Vascular/Lymphatic: Advanced atherosclerotic calcifications involving the aorta and branch vessels but no aneurysm or dissection.  No mesenteric or retroperitoneal adenopathy. Reproductive: The prostate gland and seminal vesicles are unremarkable. Other: Large amount of hemoperitoneum settling in the pelvis. No pelvic mass or pelvic adenopathy. No inguinal adenopathy. Musculoskeletal: No significant bony findings. I do not see any definite lower left rib fractures or other signs of acute bony trauma. IMPRESSION: 1. Significant splenic injury with large hematoma and hemoperitoneum. 2. 18 x 16 mm low-attenuation lesion in the pancreas at the body tail junction region. There also lesions involving the posterior wall of the stomach. Could not exclude an underlying neoplastic process. Lymphoma would be a possibility although I do not see any lymphadenopathy. The spleen could have been enlarged which lead to its rupture. Once the spleen has been taken care of, further imaging evaluation with MRI of the abdomen without and with contrast may be helpful. 3. Advanced atherosclerotic calcifications involving the aorta and branch vessels. 4. Advanced emphysematous changes. 5. Emphysema and aortic atherosclerosis. Aortic Atherosclerosis (ICD10-I70.0) and Emphysema (ICD10-J43.9). Electronically Signed   By: Rudie Meyer M.D.   On: 06/04/2020 17:12   DG ABD ACUTE 2+V W 1V CHEST  Result Date: 06/04/2020 CLINICAL  DATA:  Abdominal pain and constipation. EXAM: DG ABDOMEN ACUTE WITH 1 VIEW CHEST COMPARISON:  None. FINDINGS: The upright chest x-ray demonstrates severe bullous emphysema, particularly involving the left lung where most of the left upper lobe is a large bulla. Compressive bibasilar atelectasis. No definite infiltrates or effusions. The bony thorax is intact. Two views of the abdomen demonstrate an unremarkable bowel gas pattern. No significant stool burden or findings for small bowel obstruction. No free air. The soft tissue shadows are maintained. No worrisome calcifications. The bony structures are unremarkable. Advanced vascular calcifications for age. IMPRESSION: 1. Severe bullous emphysema. 2. No plain film findings for an acute abdominal process. Electronically Signed   By: Rudie Meyer M.D.   On: 06/04/2020 15:43    Procedures .Critical Care Performed by: Fayrene Helper, PA-C Authorized by: Fayrene Helper, PA-C   Critical care provider statement:    Critical care time (minutes):  50   Critical care was time spent personally by me on the following activities:  Discussions with consultants, evaluation of patient's response to treatment, examination of patient, ordering and performing treatments and interventions, ordering and review of laboratory studies, ordering and review of radiographic studies, pulse oximetry, re-evaluation of patient's condition, obtaining history from patient or surrogate and review of old charts   (including critical care time)  Medications Ordered in ED Medications  piperacillin-tazobactam (ZOSYN) IVPB 3.375 g (has no administration in time range)  lactated ringers bolus 1,000 mL (0 mLs Intravenous Stopped 06/04/20 1642)    And  lactated ringers bolus 1,000 mL (0 mLs Intravenous Stopped 06/04/20 1642)  ondansetron (ZOFRAN) injection 4 mg (4 mg Intravenous Given 06/04/20 1503)  piperacillin-tazobactam (ZOSYN) IVPB 3.375 g (0 g Intravenous Stopped 06/04/20 1547)   iohexol (OMNIPAQUE) 300 MG/ML solution 75 mL (75 mLs Intravenous Contrast Given 06/04/20 1622)  fentaNYL (SUBLIMAZE) injection 50 mcg (50 mcg Intravenous Given 06/04/20 1642)    ED Course  I have reviewed the triage vital signs and the nursing notes.  Pertinent labs & imaging results that were available during my care of the patient were reviewed by me and considered in my medical decision making (see chart for details).  Clinical Course as of 06/04/20 1538  Sat Jun 04, 2020  477 49 year old male appears older than stated age here with acute worsening abdominal pain after taking some laxative.  Feels lightheaded pressure low tachycardic. Diffuse abd tenderness with guarding. Getting labs imaging fluids antibiotics. [MB]    Clinical Course User Index [MB] Terrilee Files, MD   MDM Rules/Calculators/A&P                          BP 98/70   Pulse (!) 107   Temp (!) 95.7 F (35.4 C) (Rectal)   Resp 20   Ht 5\' 10"  (1.778 m)   Wt 59 kg   SpO2 94%   BMI 18.65 kg/m   Final Clinical Impression(s) / ED Diagnoses Final diagnoses:  Intraperitoneal hemorrhage  Laceration of spleen, initial encounter    Rx / DC Orders ED Discharge Orders    None     2:54 PM This is a frail appearing male appears older than stated age with complaints of abdominal pain, constipation as well as feeling nauseous for the past 2 days.  Patient is very pale in appearance, abdomen is diffusely tender on exam, and he is actively vomiting nonbloody nonbilious content.  Rectal tone is intact and no obvious stool impaction on exam.  Hemoccult negative.  Initially presents with a blood pressure of 77/60 and a heart rate of 131.  Code sepsis initiated however rectal temp is 95.7.  May cancel code sepsis if patient does not meets criteria. However, I am concerning for perforation in the abdomen, suspect gastric perf?, not actively vomiting blood.  Care discussed with Dr.  3:38 PM After I left patient's  room, nursing staff notified that patient was found on the floor.  He was vomiting previously and may have had falling over.  Cervical spine collar applied however he does not have any significant signs of trauma about his head and cervical spine. Likely syncope, will obtain head CT. At this time patient is receiving broad-spectrum antibiotic as well as IV fluid.  Blood pressure did improve.  Rectal temp is 95.7.  Fecal occult blood test is negative.  White count is markedly elevated at 19.6.  5:09 PM Blood pressure did improve with IV fluid.  Patient thinks he may have had a fall several days ago and states he may have tripped and fallen on his way to the bathroom when he tripped over his dog.  States that he fell more so on his right hip but denies any significant hip pain at that time.  CT scan of the abdomen pelvis today shows frequent blood in his abdomen as well as findings suggestive of a splenic laceration as well as multiple abnormal lesions in the peritoneum.  I suspect this is finding that happened prior to patient syncopal episode while in the ED.  I discussed care with Dr. Charm Barges who has seen and evaluated patient.  We are promptly consulted with trauma surgery will plan with plan for blood transfusion, will recheck CBC, obtain type and screen, and will consult trauma surgery for further care.  5:32 PM Dr. Hyacinth Meeker was able to consult oncall trauma surgery Dr. Hyacinth Meeker who agrees to accept pt. I have called [facility] attending Dr. Redge Gainer who agrees to accept pt for transfer ER to ER.  Pt did improve.  Pt is aware of plan. Will also start blood transfusion due to large amount of blood noted in abdominal cavity despite Hgb of 11 on initial CBC.  Code sepsis canceled.   5:42 PM On reassessment, patient is mentating appropriately, talking on the phone to his wife, appears to be  more comfortable, systolic blood pressure is 97 and at this time he is stable for transfer to [facility].    Fayrene Helper, PA-C 06/04/20 1743    Fayrene Helper, PA-C 06/04/20 1816    Terrilee Files, MD 06/04/20 [phone]

## 2020-06-04 NOTE — ED Notes (Signed)
[facility] at bedside 

## 2020-06-04 NOTE — ED Provider Notes (Signed)
Patient transferred from [facility] with significant hemoperitoneum and splenic laceration. Dr. Magda Paganini consulted.   Surgery will take to OR   Pricilla Loveless, MD 06/04/20 [phone]

## 2020-06-04 NOTE — Op Note (Signed)
Patient: Eddie Morrow ([birth year], [id number])  Date of Surgery: 06/04/2020   Preoperative Diagnosis: RUPTURED SPLEEN   Postoperative Diagnosis: RUPTURED SPLEEN, necrotic pancreatic mass  Surgical Procedure:  Splenectomy Partial wedge gastrectomy Distal pancreatectomy Gastrojejunal tube placement  Operative Team Members:  Surgeon(s) and Role:    * Eddie Morrow, Hyman Hopes, MD - Primary   Anesthesiologist: Eddie Singh, MD CRNA: Eddie Caprio, CRNA; Eddie Scale, CRNA   Anesthesia: General   Fluids:  Total I/O In: 1930 [I.V.:550; Blood loss, combination of new and old blood:1130; IV Piggyback:250] Out: 2120 [Urine:220; Blood:1900]  Complications: None  Drains:  JP Drain LUQ laying in splenic fossa and along gastric closure and pancreatic staple line  GJ tube in left upper quadrant  Specimen:  ID Type Source Tests Collected by Time Destination  1 :  Tissue PATH Spleen SURGICAL PATHOLOGY Eddie Morrow, Hyman Hopes, MD 06/04/2020 2052   2 : Mass Pancreas and Stomach Tissue PATH GI Other SURGICAL PATHOLOGY Eddie Morrow, Hyman Hopes, MD 06/04/2020 2148      Disposition:  PACU - hemodynamically stable.  Plan of Care: Admit to inpatient  - headed to ICU    Indications for Procedure: Eddie Morrow is a 49 y.o. male who presented with abdominal pain.  He was found on CT to have what appeared to be a distal pancreatic mass as well as a ruptured spleen.  The recommendation was made to proceed with exploratory laparotomy with splenectomy.  We discussed the pancreatic mass prior to surgery and that it may be ignored, may be biopsied, and may require distal pancreatectomy.  With this discussion complete and all questions answered the patient granted consent to proceed to the operating room.  Findings: Mostly old, dark, clotted hemoperitoneum and some new blood originating from the splenic hilum with a necrotic pancreatic mass eroding into the splenic hilum and the posterior wall  of the stomach.  Description of Procedure: On the date stated above patient taken the operating room suite placed in supine position, general endotracheal anesthesia was induced.  Antibiotics were given prior to the case start.  SCDs were placed on the lower extremities.  A Foley catheter was placed.  A timeout was completed verifying the correct patient, procedure, positioning, equipment needed for the case.  Patient's abdomen is prepped and draped in usual sterile fashion.  We began by making a midline laparotomy in the upper abdomen.  We dissected down into the abdomen without trauma the underlying viscera.  A large amount of clotted hemoperitoneum was encountered just under the incision.  The clots were evacuated and the abdomen was packed.  There was some new blood in the left upper quadrant near the spleen.  The abdomen was packed and anesthesia was able to keep up with resuscitation.  Then we proceeded by unpacking the left upper quadrant.  I attempted to divide the short gastrics, however I encountered dense adhesions between the pancreas and the stomach in the area of the pancreatic mass seen on CT.  I was able to enter the lesser sac below this mass by dividing the gastrocolic ligament.  The left colon was mobilized inferiorly and out of the field.  The spleen was then medialized using blunt dissection to divide its lateral attachments.  I was able to hold up in my hand and fired a white load of the endoscopic linear stapler across the splenic hilum.  The spleen was passed off the field as a specimen.  I then had a better view of the left  upper quadrant.  I entered the lesser sac by opening the pars flaccida using sharp dissection.  I was then able to encircle the mid body of the stomach below the pancreatic tumor.  I then bluntly dissected the angle of Hiss to mobilize the fundus of the stomach off of the diaphragm.  I was careful to identify the NG tube within the esophagus and protect the GE junction.   I dissected the greater curve the stomach along the fundus using bipolar energy down to the area of the pancreatic mass.  The stomach was not able to be dissected off of this pancreatic mass.  Decision was made to divide the stomach.  In order to preserve as much stomach as possible I used curved Mayo scissors to divide the stomach with a small margin from this tumor.  As I divide the stomach I effectively performed a very large wedge gastrectomy which left the stomach looking like a sleeve gastrectomy however the tumor involved more posterior than anterior wall of the stomach.  To close the large gastrotomy I placed 2-0 silk stay sutures and elevated both edges of the gastrotomy.  A green load of the endoscopic linear stapler was then used to close the gastrotomy.  2-0 Vicryl sutures were then used to oversew this along staple line in a interrupted figure-of-eight fashion.  The stomach was closed and appeared viable so I proceeded with the resection of the pancreatic tumor.  The tail the pancreas had been elevated slightly with the splenectomy so I continued that dissection in the retropancreatic plane.  I was able to bring the entire distal pancreas up off the retroperitoneum.  I then slowly fired a blue load of the endoscopic linear stapler, slowly compressing the edema out of the tissues, across the mid body of the pancreas.  The distal pancreas with the attached stomach and tumor were passed off the field as a specimen.  With the tumor resected we directed our attention to closure.  We placed a Stamm gastrojejunostomy tube through the left upper quadrant abdominal wall introducing the stomach.  I used a 2-0 silk suture pursestring with four 2-0 silk fixation sutures to the anterior abdominal wall.  The balloon was inflated and the J-tube portion was advanced into the jejunum.  The abdomen was irrigated copiously and then we direct our attention to closure.  The fascia was closed using 2-0 PDS, the wound was  irrigated, the skin was closed with staples.  Sterile dressing was applied.  All sponge needle counts correct in this case.   Eddie Drape, MD General, Bariatric, & Minimally Invasive Surgery [facility], [place]

## 2020-06-04 NOTE — Progress Notes (Signed)
12/11 Elink monitoring for sepsis protocol. Protocol cancelled and pt prepared for tx for trauma sx.

## 2020-06-04 NOTE — Consult Note (Signed)
Was called by Dr. Hyacinth Meeker in the emergency room about this patient.  He apparently had a fall several days ago and now presents with hemoperitoneum secondary to splenic rupture but also possible neoplastic process involving the tail of the pancreas and stomach.  His hemoglobin at this point appears stable and his blood pressure is responding to fluid boluses.  I did review his labs and CT scan findings.  He also has significant COPD.  This patient needs referral to a higher level of care due to our limited ability to obtain blood products and provide onsite critical care.

## 2020-06-04 NOTE — Progress Notes (Signed)
Pharmacy Antibiotic Note  Eddie Morrow is a 49 y.o. male admitted on 06/04/2020 with intra-abdominal infection.  Pharmacy has been consulted for zosyn dosing.  Plan: Zosyn 3.375g IV q8h (4 hour infusion).  F/u cxs and clinical progress Monitor V/S, labs  Height: 5\' 10"  (177.8 cm) Weight: 59 kg (130 lb) IBW/kg (Calculated) : 73  Temp (24hrs), Avg:95.7 F (35.4 C), Min:95.7 F (35.4 C), Max:95.7 F (35.4 C)  No results for input(s): WBC, CREATININE, LATICACIDVEN, VANCOTROUGH, VANCOPEAK, VANCORANDOM, GENTTROUGH, GENTPEAK, GENTRANDOM, TOBRATROUGH, TOBRAPEAK, TOBRARND, AMIKACINPEAK, AMIKACINTROU, AMIKACIN in the last 168 hours.  CrCl cannot be calculated (No successful lab value found.).  51mls/min  No Known Allergies  Antimicrobials this admission: Zosyn 12/11 >>  Microbiology results: 12/11 BCx: pending  Thank you for allowing pharmacy to be a part of this patient's care.  14/11, BS Pharm D, Elder [facility] Clinical Pharmacist Pager [phone] 06/04/2020 2:50 PM

## 2020-06-04 NOTE — ED Notes (Signed)
Went into patients room and pt was lying in floor. Pt stated he was trying to throw up in emesis bag and fell forwards into the floor. Pt denied losing consciousness. Pt denied hitting head  Pt placed in C-SPINE precautions and C -Collar placed. Provider Called to Bedside. Pt placed back in bed after provider examined pt. Pt placed back in bed by 4 ED staff members. IV established. PT placed on monitors.

## 2020-06-05 ENCOUNTER — Inpatient Hospital Stay (HOSPITAL_COMMUNITY): Payer: Self-pay

## 2020-06-05 LAB — HIV ANTIBODY (ROUTINE TESTING W REFLEX): HIV Screen 4th Generation wRfx: NONREACTIVE

## 2020-06-05 LAB — URINALYSIS, ROUTINE W REFLEX MICROSCOPIC
Bacteria, UA: NONE SEEN
Bilirubin Urine: NEGATIVE
Glucose, UA: NEGATIVE mg/dL
Hgb urine dipstick: NEGATIVE
Ketones, ur: NEGATIVE mg/dL
Leukocytes,Ua: NEGATIVE
Nitrite: NEGATIVE
Protein, ur: 30 mg/dL — AB
Specific Gravity, Urine: 1.035 — ABNORMAL HIGH (ref 1.005–1.030)
pH: 5 (ref 5.0–8.0)

## 2020-06-05 LAB — PREPARE FRESH FROZEN PLASMA: Unit division: 0

## 2020-06-05 LAB — COMPREHENSIVE METABOLIC PANEL
ALT: 34 U/L (ref 0–44)
AST: 72 U/L — ABNORMAL HIGH (ref 15–41)
Albumin: 2.2 g/dL — ABNORMAL LOW (ref 3.5–5.0)
Alkaline Phosphatase: 42 U/L (ref 38–126)
Anion gap: 12 (ref 5–15)
BUN: 16 mg/dL (ref 6–20)
CO2: 23 mmol/L (ref 22–32)
Calcium: 7.8 mg/dL — ABNORMAL LOW (ref 8.9–10.3)
Chloride: 101 mmol/L (ref 98–111)
Creatinine, Ser: 1.49 mg/dL — ABNORMAL HIGH (ref 0.61–1.24)
GFR, Estimated: 58 mL/min — ABNORMAL LOW (ref >60)
Glucose, Bld: 254 mg/dL — ABNORMAL HIGH (ref 70–99)
Potassium: 5.9 mmol/L — ABNORMAL HIGH (ref 3.5–5.1)
Sodium: 136 mmol/L (ref 135–145)
Total Bilirubin: 1.7 mg/dL — ABNORMAL HIGH (ref 0.3–1.2)
Total Protein: 4 g/dL — ABNORMAL LOW (ref 6.5–8.1)

## 2020-06-05 LAB — BPAM RBC
Blood Product Expiration Date: 202201112359
Blood Product Expiration Date: 202201152359
ISSUE DATE / TIME: 202112111733
ISSUE DATE / TIME: 202112111733
Unit Type and Rh: 9500
Unit Type and Rh: 9500

## 2020-06-05 LAB — BPAM FFP
Blood Product Expiration Date: 202112142359
Blood Product Expiration Date: 202112142359
ISSUE DATE / TIME: 202112112119
ISSUE DATE / TIME: 202112112119
Unit Type and Rh: 6200
Unit Type and Rh: 6200

## 2020-06-05 LAB — POCT I-STAT 7, (LYTES, BLD GAS, ICA,H+H)
Acid-Base Excess: 1 mmol/L (ref 0.0–2.0)
Acid-Base Excess: 1 mmol/L (ref 0.0–2.0)
Acid-Base Excess: 2 mmol/L (ref 0.0–2.0)
Bicarbonate: 23.4 mmol/L (ref 20.0–28.0)
Bicarbonate: 24.9 mmol/L (ref 20.0–28.0)
Bicarbonate: 25.7 mmol/L (ref 20.0–28.0)
Calcium, Ion: 1.08 mmol/L — ABNORMAL LOW (ref 1.15–1.40)
Calcium, Ion: 1.1 mmol/L — ABNORMAL LOW (ref 1.15–1.40)
Calcium, Ion: 1.1 mmol/L — ABNORMAL LOW (ref 1.15–1.40)
HCT: 19 % — ABNORMAL LOW (ref 39.0–52.0)
HCT: 22 % — ABNORMAL LOW (ref 39.0–52.0)
HCT: 23 % — ABNORMAL LOW (ref 39.0–52.0)
Hemoglobin: 6.5 g/dL — CL (ref 13.0–17.0)
Hemoglobin: 7.5 g/dL — ABNORMAL LOW (ref 13.0–17.0)
Hemoglobin: 7.8 g/dL — ABNORMAL LOW (ref 13.0–17.0)
O2 Saturation: 100 %
O2 Saturation: 66 %
O2 Saturation: 98 %
Patient temperature: 97.6
Patient temperature: 99.1
Patient temperature: 99.5
Potassium: 4.6 mmol/L (ref 3.5–5.1)
Potassium: 5 mmol/L (ref 3.5–5.1)
Potassium: 5.4 mmol/L — ABNORMAL HIGH (ref 3.5–5.1)
Sodium: 135 mmol/L (ref 135–145)
Sodium: 135 mmol/L (ref 135–145)
Sodium: 137 mmol/L (ref 135–145)
TCO2: 24 mmol/L (ref 22–32)
TCO2: 26 mmol/L (ref 22–32)
TCO2: 27 mmol/L (ref 22–32)
pCO2 arterial: 28.6 mmHg — ABNORMAL LOW (ref 32.0–48.0)
pCO2 arterial: 35.8 mmHg (ref 32.0–48.0)
pCO2 arterial: 36 mmHg (ref 32.0–48.0)
pH, Arterial: 7.45 (ref 7.350–7.450)
pH, Arterial: 7.465 — ABNORMAL HIGH (ref 7.350–7.450)
pH, Arterial: 7.52 — ABNORMAL HIGH (ref 7.350–7.450)
pO2, Arterial: 156 mmHg — ABNORMAL HIGH (ref 83.0–108.0)
pO2, Arterial: 32 mmHg — CL (ref 83.0–108.0)
pO2, Arterial: 96 mmHg (ref 83.0–108.0)

## 2020-06-05 LAB — TYPE AND SCREEN
ABO/RH(D): A POS
Antibody Screen: NEGATIVE
Unit division: 0
Unit division: 0

## 2020-06-05 LAB — MRSA PCR SCREENING: MRSA by PCR: NEGATIVE

## 2020-06-05 LAB — CBC
HCT: 28.5 % — ABNORMAL LOW (ref 39.0–52.0)
Hemoglobin: 9.5 g/dL — ABNORMAL LOW (ref 13.0–17.0)
MCH: 30.7 pg (ref 26.0–34.0)
MCHC: 33.3 g/dL (ref 30.0–36.0)
MCV: 92.2 fL (ref 80.0–100.0)
Platelets: 152 10*3/uL (ref 150–400)
RBC: 3.09 MIL/uL — ABNORMAL LOW (ref 4.22–5.81)
RDW: 15.2 % (ref 11.5–15.5)
WBC: 15.9 10*3/uL — ABNORMAL HIGH (ref 4.0–10.5)
nRBC: 0 % (ref 0.0–0.2)

## 2020-06-05 LAB — TRIGLYCERIDES: Triglycerides: 148 mg/dL (ref <150)

## 2020-06-05 LAB — LACTIC ACID, PLASMA
Lactic Acid, Venous: 6.4 mmol/L (ref 0.5–1.9)
Lactic Acid, Venous: 2.5 mmol/L (ref 0.5–1.9)
Lactic Acid, Venous: 2.7 mmol/L (ref 0.5–1.9)

## 2020-06-05 LAB — APTT: aPTT: 29 seconds (ref 24–36)

## 2020-06-05 LAB — PREPARE RBC (CROSSMATCH)

## 2020-06-05 LAB — PROTIME-INR
INR: 1.4 — ABNORMAL HIGH (ref 0.8–1.2)
Prothrombin Time: 16.3 seconds — ABNORMAL HIGH (ref 11.4–15.2)

## 2020-06-05 LAB — GLUCOSE, CAPILLARY: Glucose-Capillary: 228 mg/dL — ABNORMAL HIGH (ref 70–99)

## 2020-06-05 MED ORDER — ACETAMINOPHEN 10 MG/ML IV SOLN
1000.0000 mg | Freq: Four times a day (QID) | INTRAVENOUS | Status: AC
  Administered 2020-06-05 (×4): 1000 mg via INTRAVENOUS
  Filled 2020-06-05 (×4): qty 100

## 2020-06-05 MED ORDER — MENINGOCOCCAL A C Y&W-135 OLIG IM SOLR
0.5000 mL | INTRAMUSCULAR | Status: DC | PRN
  Filled 2020-06-05: qty 0.5

## 2020-06-05 MED ORDER — HAEMOPHILUS B POLYSAC CONJ VAC IM SOLR
0.5000 mL | INTRAMUSCULAR | Status: DC | PRN
  Filled 2020-06-05: qty 0.5

## 2020-06-05 MED ORDER — ORAL CARE MOUTH RINSE
15.0000 mL | Freq: Two times a day (BID) | OROMUCOSAL | Status: DC
  Administered 2020-06-05 – 2020-06-12 (×13): 15 mL via OROMUCOSAL

## 2020-06-05 MED ORDER — PANTOPRAZOLE SODIUM 40 MG IV SOLR
40.0000 mg | Freq: Two times a day (BID) | INTRAVENOUS | Status: DC
  Administered 2020-06-05 – 2020-06-09 (×9): 40 mg via INTRAVENOUS
  Filled 2020-06-05 (×9): qty 40

## 2020-06-05 MED ORDER — SODIUM CHLORIDE 0.9% IV SOLUTION: Freq: Once | INTRAVENOUS | Status: DC

## 2020-06-05 MED ORDER — HYDROMORPHONE HCL 1 MG/ML IJ SOLN
0.5000 mg | INTRAMUSCULAR | Status: DC | PRN
  Administered 2020-06-05 – 2020-06-09 (×23): 0.5 mg via INTRAVENOUS
  Filled 2020-06-05: qty 0.5
  Filled 2020-06-05: qty 1
  Filled 2020-06-05 (×6): qty 0.5
  Filled 2020-06-05 (×6): qty 1
  Filled 2020-06-05: qty 0.5
  Filled 2020-06-05: qty 1
  Filled 2020-06-05: qty 0.5
  Filled 2020-06-05: qty 1
  Filled 2020-06-05: qty 0.5
  Filled 2020-06-05 (×3): qty 1
  Filled 2020-06-05: qty 0.5

## 2020-06-05 MED ORDER — PNEUMOCOCCAL 13-VAL CONJ VACC IM SUSP
0.5000 mL | INTRAMUSCULAR | Status: DC | PRN
  Filled 2020-06-05: qty 0.5

## 2020-06-05 NOTE — Progress Notes (Addendum)
Progress Note: General Surgery Service   Chief Complaint/Subjective: Stable on the ventilator overnight.  Extubated this morning and talking, in good spirits.  Neo drip has been decreasing since anesthesia.  Pain is a 7/10 around his G tube.  Objective: Vital signs in last 24 hours: Temp:  [95.7 F (35.4 C)-99.5 F (37.5 C)] 99.5 F (37.5 C) (12/12 0400) Pulse Rate:  [86-132] 111 (12/12 0600) Resp:  [12-35] 24 (12/12 0600) BP: (74-115)/(33-95) 101/84 (12/12 0600) SpO2:  [92 %-100 %] 100 % (12/12 0600) FiO2 (%):  [30 %-40 %] 30 % (12/12 0403) Weight:  [54.9 kg-59 kg] 54.9 kg (12/12 0400) Last BM Date: 06/02/20  Intake/Output from previous day: 12/11 0701 - 12/12 0700 In: 6812.6 [I.V.:3251.3; Blood:1130; NG/GT:30; IV Piggyback:2401.3] Out: 2515 [Urine:495; Drains:120; Blood:1900] Intake/Output this shift: Total I/O In: 4769 [I.V.:3251.3; Blood:1130; NG/GT:30; IV Piggyback:357.7] Out: 2515 [Urine:495; Drains:120; Blood:1900]  Gen: Laying in ICU bed  Resp: Extubated, distant breath sounds  Card: tachycardic, normotensive  Abd: incision c/d/i w/ staples, JP serosanguinous drainage, NG with dark fluid draining, G-tube with dark fluid in straight drainage bag, J tube clamped  Lab Results: CBC  Recent Labs    06/04/20 1725 06/04/20 2044 06/05/20 0100 06/05/20 0416 06/05/20 0625  WBC 15.7*  --  15.9*  --   --   HGB 8.3*   < > 9.5* 6.5* 7.5*  HCT 26.2*   < > 28.5* 19.0* 22.0*  PLT 365  --  152  --   --    < > = values in this interval not displayed.   BMET Recent Labs    06/04/20 1420 06/04/20 2044 06/04/20 2129 06/05/20 0416 06/05/20 0625  NA 134* 134*   < > 137 135  K 4.0 5.5*   < > 4.6 5.0  CL 97* 99  --   --   --   CO2 16*  --   --   --   --   GLUCOSE 232* 160*  --   --   --   BUN 16 20  --   --   --   CREATININE 2.13* 1.10  --   --   --   CALCIUM 8.9  --   --   --   --    < > = values in this interval not displayed.   PT/INR Recent Labs     06/04/20 1420 06/05/20 0551  LABPROT 15.3* 16.3*  INR 1.3* 1.4*   ABG Recent Labs    06/05/20 0416 06/05/20 0625  PHART 7.450 7.465*  HCO3 24.9 25.7    Anti-infectives: Anti-infectives (From admission, onward)   Start     Dose/Rate Route Frequency Ordered Stop   06/04/20 2200  piperacillin-tazobactam (ZOSYN) IVPB 3.375 g  Status:  Discontinued        3.375 g 12.5 mL/hr over 240 Minutes Intravenous Every 8 hours 06/04/20 1450 06/05/20 0654   06/04/20 2030  metroNIDAZOLE (FLAGYL) IVPB 500 mg        500 mg 100 mL/hr over 60 Minutes Intravenous STAT 06/04/20 2018 06/04/20 2057   06/04/20 1500  piperacillin-tazobactam (ZOSYN) IVPB 3.375 g        3.375 g 100 mL/hr over 30 Minutes Intravenous  Once 06/04/20 1448 06/04/20 1547   06/04/20 1445  cefTRIAXone (ROCEPHIN) 2 g in sodium chloride 0.9 % 100 mL IVPB  Status:  Discontinued        2 g 200 mL/hr over 30 Minutes Intravenous Every 24 hours 06/04/20 1432  06/04/20 1447      Medications: Scheduled Meds: . sodium chloride   Intravenous Once  . chlorhexidine gluconate (MEDLINE KIT)  15 mL Mouth Rinse BID  . Chlorhexidine Gluconate Cloth  6 each Topical Daily  . mouth rinse  15 mL Mouth Rinse 10 times per day  . pantoprazole (PROTONIX) IV  40 mg Intravenous Q12H   Continuous Infusions: . sodium chloride    . acetaminophen    . lactated ringers Stopped (06/05/20 0522)  . phenylephrine (NEO-SYNEPHRINE) Adult infusion 30 mcg/min (06/05/20 0600)   PRN Meds:.haemophilus B polysaccharide conjugate vaccine, HYDROmorphone (DILAUDID) injection, [START ON 06/06/2020] meningococcal oligosaccharide, ondansetron **OR** ondansetron (ZOFRAN) IV, pneumococcal 13-valent conjugate vaccine, prochlorperazine **OR** prochlorperazine  Assessment/Plan: Eddie Morrow is a 49 year old male s/p open splenectomy, distal pancreatectomy, wedge gastrectomy and G-J tube placement on 12/11 for bleeding pancreatic mass that was invading the posterior wall of the  stomach and splenic hilum.  Bleeding pancreatic mass s/p sx as above - Strict NPO, consider UGI prior to feeding to check integrity of long wedge gastrectomy staple line - NG to LIWS, G tube to straight drain - PPI BID - follow up pathology - J tube clamped, can start trickle tube feeds tomorrow  Acute blood loss anemia - 2 units pRBC at outside ED, 3 units prbc and 2 units FFP in OR - Check PT/INR - Hold lovenox till HGB stable  Acute ventilator dependent respiratory failure (postoperative) - Extubated patient with nurse and respiratory therapy at bedside this morning - Passed CPAP trial, difficult stick to get ABG, following commands.  Now doing well extubated - nasal cannula - monitor in ICU today  Leukocytosis - post op and post splenectomy, expect to be elevated  Lactic acidosis - Clearing 2.5 this AM from 6.4 last night - Continue IVF resuscitation   FEN: IVF ID: None VTE: SCDs, Lovenox on hold Foley: Continue for strict IO Dispo: continued care in ICU, likely transfer out Monday if doing well  CRITICAL CARE Performed by: Nickola Major    Total critical care time: 35 minutes  Critical care time was exclusive of separately billable procedures and treating other patients.  Critical care was necessary to treat or prevent imminent or life-threatening deterioration.  Critical care was time spent personally by me on the following activities: development of treatment plan with patient and/or surrogate as well as nursing, discussions with consultants, evaluation of patient's response to treatment, examination of patient, obtaining history from patient or surrogate, ordering and performing treatments and interventions, ordering and review of laboratory studies, ordering and review of radiographic studies, pulse oximetry and re-evaluation of patient's condition.     LOS: 1 day   Felicie Morn, MD [facility]

## 2020-06-05 NOTE — Anesthesia Postprocedure Evaluation (Signed)
Anesthesia Post Note  Patient: Eddie Morrow  Procedure(s) Performed: SPLENECTOMY, PARTIAL GASTRECTOMY, DISTAL PANCREATECTOMY, GASTRO-JEJUNO TUBE PLACEMENT (N/A Abdomen)     Anesthesia Post Evaluation No complications documented.  Last Vitals:  Vitals:   06/05/20 0030 06/05/20 0045  BP: 92/71 93/72  Pulse: (!) 120 (!) 123  Resp: 12 12  Temp:    SpO2: 100% 100%    Last Pain:  Vitals:   06/05/20 0000  TempSrc: Oral  PainSc: 0-No pain                 Izaac Reisig

## 2020-06-05 NOTE — Procedures (Signed)
Extubation Procedure Note  Patient Details:   Name: Eddie Morrow DOB: 01-21-1971 MRN: [id number]   Airway Documentation:    Vent end date: 06/05/20 Vent end time: 0631   Evaluation  O2 sats: stable throughout Complications: No apparent complications Patient did tolerate procedure well. Bilateral Breath Sounds: Diminished   Yes  Draven Natter R Gerasimos Plotts 06/05/2020, 6:36 AM

## 2020-06-05 NOTE — Anesthesia Postprocedure Evaluation (Signed)
Anesthesia Post Note  Patient: Eddie Morrow  Procedure(s) Performed: SPLENECTOMY, PARTIAL GASTRECTOMY, DISTAL PANCREATECTOMY, GASTRO-JEJUNO TUBE PLACEMENT (N/A Abdomen)     Patient location during evaluation: ICU Anesthesia Type: General Level of consciousness: patient remains intubated per anesthesia plan Pain management: pain level controlled Vital Signs Assessment: post-procedure vital signs reviewed and stable Respiratory status: patient remains intubated per anesthesia plan Cardiovascular status: stable Postop Assessment: no apparent nausea or vomiting Anesthetic complications: no   No complications documented.  Last Vitals:  Vitals:   06/05/20 0030 06/05/20 0045  BP: 92/71 93/72  Pulse: (!) 120 (!) 123  Resp: 12 12  Temp:    SpO2: 100% 100%    Last Pain:  Vitals:   06/05/20 0000  TempSrc: Oral  PainSc: 0-No pain                 Meron Bocchino

## 2020-06-05 NOTE — Plan of Care (Signed)
  Problem: Clinical Measurements: Goal: Ability to maintain clinical measurements within normal limits will improve Outcome: Progressing Goal: Will remain free from infection Outcome: Progressing Goal: Diagnostic test results will improve Outcome: Progressing Goal: Respiratory complications will improve Outcome: Progressing Goal: Cardiovascular complication will be avoided Outcome: Progressing   Problem: Activity: Goal: Ability to tolerate increased activity will improve Outcome: Progressing   Problem: Pain Managment: Goal: General experience of comfort will improve Outcome: Progressing

## 2020-06-06 ENCOUNTER — Encounter (HOSPITAL_COMMUNITY): Payer: Self-pay | Admitting: Surgery

## 2020-06-06 DIAGNOSIS — E43 Unspecified severe protein-calorie malnutrition: Secondary | ICD-10-CM | POA: Insufficient documentation

## 2020-06-06 LAB — BASIC METABOLIC PANEL
Anion gap: 9 (ref 5–15)
BUN: 12 mg/dL (ref 6–20)
CO2: 27 mmol/L (ref 22–32)
Calcium: 7.8 mg/dL — ABNORMAL LOW (ref 8.9–10.3)
Chloride: 100 mmol/L (ref 98–111)
Creatinine, Ser: 0.53 mg/dL — ABNORMAL LOW (ref 0.61–1.24)
GFR, Estimated: >60 mL/min (ref >60)
Glucose, Bld: 112 mg/dL — ABNORMAL HIGH (ref 70–99)
Potassium: 4.2 mmol/L (ref 3.5–5.1)
Sodium: 136 mmol/L (ref 135–145)

## 2020-06-06 LAB — PREPARE RBC (CROSSMATCH)

## 2020-06-06 LAB — GLUCOSE, CAPILLARY: Glucose-Capillary: 107 mg/dL — ABNORMAL HIGH (ref 70–99)

## 2020-06-06 LAB — CBC
HCT: 18.9 % — ABNORMAL LOW (ref 39.0–52.0)
Hemoglobin: 6.3 g/dL — CL (ref 13.0–17.0)
MCH: 30.6 pg (ref 26.0–34.0)
MCHC: 33.3 g/dL (ref 30.0–36.0)
MCV: 91.7 fL (ref 80.0–100.0)
Platelets: 194 10*3/uL (ref 150–400)
RBC: 2.06 MIL/uL — ABNORMAL LOW (ref 4.22–5.81)
RDW: 15.3 % (ref 11.5–15.5)
WBC: 20.2 10*3/uL — ABNORMAL HIGH (ref 4.0–10.5)
nRBC: 0.1 % (ref 0.0–0.2)

## 2020-06-06 LAB — HEMOGLOBIN AND HEMATOCRIT, BLOOD
HCT: 24.7 % — ABNORMAL LOW (ref 39.0–52.0)
Hemoglobin: 8.2 g/dL — ABNORMAL LOW (ref 13.0–17.0)

## 2020-06-06 MED ORDER — VITAL HIGH PROTEIN PO LIQD
1000.0000 mL | ORAL | Status: DC
  Administered 2020-06-06: 10:00:00 1000 mL

## 2020-06-06 MED ORDER — SODIUM CHLORIDE 0.9% IV SOLUTION: Freq: Once | INTRAVENOUS | Status: AC

## 2020-06-06 MED ORDER — PIVOT 1.5 CAL PO LIQD
1000.0000 mL | ORAL | Status: DC
  Administered 2020-06-06 – 2020-06-08 (×3): 1000 mL
  Filled 2020-06-06 (×2): qty 1000

## 2020-06-06 NOTE — Progress Notes (Signed)
2 Days Post-Op   Subjective/Chief Complaint: Up in chair. Thought he passed a tiny amount of flatus.   Objective: Vital signs in last 24 hours: Temp:  [98 F (36.7 C)-98.5 F (36.9 C)] 98 F (36.7 C) (12/13 0757) Pulse Rate:  [63-109] 88 (12/13 0800) Resp:  [8-22] 12 (12/13 0800) BP: (93-131)/(46-93) 108/46 (12/13 0800) SpO2:  [92 %-98 %] 92 % (12/13 0800) Last BM Date: 06/02/20  Intake/Output from previous day: 12/12 0701 - 12/13 0700 In: 2319.3 [I.V.:1331.7; Blood:667.1; IV Piggyback:320.5] Out: 2870 [Urine:2275; Emesis/NG output:100; Drains:495] Intake/Output this shift: Total I/O In: 75.1 [I.V.:75.1] Out: 120 [Urine:100; Drains:20]  General appearance: alert and cooperative Resp: clear to auscultation bilaterally Cardio: regular rate and rhythm GI: soft, incision dressing CDI, GJ tube Neurologic: Mental status: Alert, oriented, thought content appropriate  Lab Results:  Recent Labs    06/05/20 0100 06/05/20 0416 06/05/20 0625 06/06/20 0147  WBC 15.9*  --   --  20.2*  HGB 9.5*   < > 7.5* 6.3*  HCT 28.5*   < > 22.0* 18.9*  PLT 152  --   --  194   < > = values in this interval not displayed.   BMET Recent Labs    06/05/20 0050 06/05/20 0416 06/05/20 0625 06/06/20 0147  NA 136   < > 135 136  K 5.9*   < > 5.0 4.2  CL 101  --   --  100  CO2 23  --   --  27  GLUCOSE 254*  --   --  112*  BUN 16  --   --  12  CREATININE 1.49*  --   --  0.53*  CALCIUM 7.8*  --   --  7.8*   < > = values in this interval not displayed.   PT/INR Recent Labs    06/04/20 1420 06/05/20 0551  LABPROT 15.3* 16.3*  INR 1.3* 1.4*   ABG Recent Labs    06/05/20 0416 06/05/20 0625  PHART 7.450 7.465*  HCO3 24.9 25.7    Studies/Results: CT ABDOMEN PELVIS W CONTRAST  Result Date: 06/04/2020 CLINICAL DATA:  Abdominal pain. EXAM: CT ABDOMEN AND PELVIS WITH CONTRAST TECHNIQUE: Multidetector CT imaging of the abdomen and pelvis was performed using the standard protocol  following bolus administration of intravenous contrast. CONTRAST:  18mL OMNIPAQUE IOHEXOL 300 MG/ML  SOLN COMPARISON:  None. FINDINGS: Lower chest: Advanced emphysematous changes are noted. The heart is normal in size. Coronary artery calcifications are also noted. Hepatobiliary: No hepatic lesions or intrahepatic biliary dilatation. The gallbladder appears normal. No common bile duct dilatation. Pancreas: There is a 18 x 16 mm low-attenuation lesion in the pancreas at the body tail junction region. No pancreatic ductal dilatation. Spleen: There is a large splenic hematoma likely due to a laceration and a large intra splenic subcapsular hematoma along with a large amount hemoperitoneum extending down into the pelvis. Adrenals/Urinary Tract: The adrenal glands and kidneys are unremarkable. The bladder is unremarkable. Stomach/Bowel: There is fairly significant mass effect on the stomach by the large splenic hematoma. There also nodular areas of low attenuation involving the posterior gastric wall worrisome for tumor. The duodenum, small bowel and colon are unremarkable. Vascular/Lymphatic: Advanced atherosclerotic calcifications involving the aorta and branch vessels but no aneurysm or dissection. No mesenteric or retroperitoneal adenopathy. Reproductive: The prostate gland and seminal vesicles are unremarkable. Other: Large amount of hemoperitoneum settling in the pelvis. No pelvic mass or pelvic adenopathy. No inguinal adenopathy. Musculoskeletal: No significant bony  findings. I do not see any definite lower left rib fractures or other signs of acute bony trauma. IMPRESSION: 1. Significant splenic injury with large hematoma and hemoperitoneum. 2. 18 x 16 mm low-attenuation lesion in the pancreas at the body tail junction region. There also lesions involving the posterior wall of the stomach. Could not exclude an underlying neoplastic process. Lymphoma would be a possibility although I do not see any  lymphadenopathy. The spleen could have been enlarged which lead to its rupture. Once the spleen has been taken care of, further imaging evaluation with MRI of the abdomen without and with contrast may be helpful. 3. Advanced atherosclerotic calcifications involving the aorta and branch vessels. 4. Advanced emphysematous changes. 5. Emphysema and aortic atherosclerosis. Aortic Atherosclerosis (ICD10-I70.0) and Emphysema (ICD10-J43.9). Electronically Signed   By: Rudie Meyer M.D.   On: 06/04/2020 17:12   DG ABD ACUTE 2+V W 1V CHEST  Result Date: 06/04/2020 CLINICAL DATA:  Abdominal pain and constipation. EXAM: DG ABDOMEN ACUTE WITH 1 VIEW CHEST COMPARISON:  None. FINDINGS: The upright chest x-ray demonstrates severe bullous emphysema, particularly involving the left lung where most of the left upper lobe is a large bulla. Compressive bibasilar atelectasis. No definite infiltrates or effusions. The bony thorax is intact. Two views of the abdomen demonstrate an unremarkable bowel gas pattern. No significant stool burden or findings for small bowel obstruction. No free air. The soft tissue shadows are maintained. No worrisome calcifications. The bony structures are unremarkable. Advanced vascular calcifications for age. IMPRESSION: 1. Severe bullous emphysema. 2. No plain film findings for an acute abdominal process. Electronically Signed   By: Rudie Meyer M.D.   On: 06/04/2020 15:43    Anti-infectives: Anti-infectives (From admission, onward)   Start     Dose/Rate Route Frequency Ordered Stop   06/04/20 2200  piperacillin-tazobactam (ZOSYN) IVPB 3.375 g  Status:  Discontinued        3.375 g 12.5 mL/hr over 240 Minutes Intravenous Every 8 hours 06/04/20 1450 06/05/20 0654   06/04/20 2030  metroNIDAZOLE (FLAGYL) IVPB 500 mg        500 mg 100 mL/hr over 60 Minutes Intravenous STAT 06/04/20 2018 06/04/20 2057   06/04/20 1500  piperacillin-tazobactam (ZOSYN) IVPB 3.375 g        3.375 g 100 mL/hr over  30 Minutes Intravenous  Once 06/04/20 1448 06/04/20 1547   06/04/20 1445  cefTRIAXone (ROCEPHIN) 2 g in sodium chloride 0.9 % 100 mL IVPB  Status:  Discontinued        2 g 200 mL/hr over 30 Minutes Intravenous Every 24 hours 06/04/20 1432 06/04/20 1447      Assessment/Plan: Mr. Haapala is a 49 year old male s/p open splenectomy, distal pancreatectomy, wedge gastrectomy and G-J tube placement on 12/11 for bleeding pancreatic mass that was invading the posterior wall of the stomach and splenic hilum.  Bleeding pancreatic mass s/p sx as above - few ice chips, plan UGI prior to feeding to check integrity of long wedge gastrectomy staple line - NG to LIWS, G tube to straight drain - PPI BID - follow up pathology - start trickle TF per J tube  Acute blood loss anemia - 2 units pRBC at outside ED, 3 units prbc and 2 units FFP in OR - 1u PRBC this AM - Hold lovenox till HGB stable  Acute hypoxicrespiratory failure (postoperative) - Extubated and doing well  Leukocytosis - post op and post splenectomy, expect to be elevated  FEN: IVF ID: None  VTE: SCDs, Lovenox on hold Foley: D/C Dispo: transfer to progressive I also spoke with his wife on facetime  LOS: 2 days    Liz Malady 06/06/2020

## 2020-06-06 NOTE — Evaluation (Signed)
Physical Therapy Evaluation Patient Details Name: Eddie Morrow MRN: [id number] DOB: 06/28/1970 Today's Date: 06/06/2020   History of Present Illness  Eddie Morrow is a 49 year old male s/p open splenectomy, distal pancreatectomy, wedge gastrectomy and G-J tube placement on 12/11 for bleeding pancreatic mass that was invading the posterior wall of the stomach and splenic hilum.  Clinical Impression  Pt admitted with above diagnosis. Pt was able to ambulate with RW with min guard assist in hallways. Progressing overall limited some by pain but expect good progress.  Will follow acutely.  Pt currently with functional limitations due to the deficits listed below (see PT Problem List). Pt will benefit from skilled PT to increase their independence and safety with mobility to allow discharge to the venue listed below.      Follow Up Recommendations Home health PT;Supervision/Assistance - 24 hour (may progress to no HHPT)    Equipment Recommendations  Rolling walker with 5" wheels;3in1 (PT) (may not need equipment depending on progress)    Recommendations for Other Services       Precautions / Restrictions Precautions Precautions: Fall Precaution Comments: G-tube, JP drain, NG tube Restrictions Weight Bearing Restrictions: No      Mobility  Bed Mobility Overal bed mobility: Needs Assistance Bed Mobility: Rolling;Sidelying to Sit Rolling: Min assist Sidelying to sit: Min assist       General bed mobility comments: Pt was able to roll with min assist and side to sit with min assist due to abdominal pain    Transfers Overall transfer level: Needs assistance Equipment used: Rolling walker (2 wheeled) Transfers: Sit to/from Stand Sit to Stand: Min guard         General transfer comment: Pt was able to stand with min guard steadying assist to RW  Ambulation/Gait Ambulation/Gait assistance: Min guard Gait Distance (Feet): 350 Feet Assistive device: Rolling walker (2  wheeled) Gait Pattern/deviations: Step-through pattern;Decreased stride length;Trunk flexed   Gait velocity interpretation: <1.31 ft/sec, indicative of household ambulator General Gait Details: Pt maintains slightly flexed posture due to sore abdomen but was trying to get fully upright.  Gait steady with RW with pt stating he was using his arms more than he thought he would.  Stairs            Wheelchair Mobility    Modified Rankin (Stroke Patients Only)       Balance Overall balance assessment: Needs assistance Sitting-balance support: No upper extremity supported;Feet supported Sitting balance-Leahy Scale: Fair     Standing balance support: Bilateral upper extremity supported;During functional activity Standing balance-Leahy Scale: Poor Standing balance comment: relies on UE support for balance on RW                             Pertinent Vitals/Pain Pain Assessment: 0-10 Pain Score: 3  Pain Location: incision Pain Descriptors / Indicators: Cramping Pain Intervention(s): Limited activity within patient's tolerance;Monitored during session;Repositioned;Premedicated before session    Home Living Family/patient expects to be discharged to:: Private residence Living Arrangements: Spouse/significant other;Children Available Help at Discharge: Family;Available 24 hours/day Type of Home: House Home Access: Stairs to enter Entrance Stairs-Rails: Right Entrance Stairs-Number of Steps: 3 Home Layout: Two level;Able to live on main level with bedroom/bathroom Home Equipment: None      Prior Function Level of Independence: Independent               Hand Dominance   Dominant Hand: Right    Extremity/Trunk  Assessment   Upper Extremity Assessment Upper Extremity Assessment: Defer to OT evaluation    Lower Extremity Assessment Lower Extremity Assessment: Generalized weakness    Cervical / Trunk Assessment Cervical / Trunk Assessment: Kyphotic  (Due to pain)  Communication   Communication: No difficulties  Cognition Arousal/Alertness: Awake/alert Behavior During Therapy: WFL for tasks assessed/performed Overall Cognitive Status: Within Functional Limits for tasks assessed                                        General Comments General comments (skin integrity, edema, etc.): HR 89-138 bpm with activity.  Sats intiially 98% on 3L.  Did not walk pt with O2.  Pt desaturated to 82% at end of walk and replaced O2 at 3L and took incr time of 5 min to return to 90%.  nurse made aware.    Exercises     Assessment/Plan    PT Assessment Patient needs continued PT services  PT Problem List Decreased activity tolerance;Decreased balance;Decreased mobility;Decreased knowledge of precautions;Decreased knowledge of use of DME;Decreased safety awareness;Cardiopulmonary status limiting activity;Pain       PT Treatment Interventions DME instruction;Gait training;Functional mobility training;Therapeutic activities;Therapeutic exercise;Balance training;Stair training;Patient/family education    PT Goals (Current goals can be found in the Care Plan section)  Acute Rehab PT Goals Patient Stated Goal: to gohome PT Goal Formulation: With patient Time For Goal Achievement: 06/20/20 Potential to Achieve Goals: Good    Frequency Min 3X/week   Barriers to discharge        Co-evaluation               AM-PAC PT "6 Clicks" Mobility  Outcome Measure Help needed turning from your back to your side while in a flat bed without using bedrails?: A Little Help needed moving from lying on your back to sitting on the side of a flat bed without using bedrails?: A Little Help needed moving to and from a bed to a chair (including a wheelchair)?: A Little Help needed standing up from a chair using your arms (e.g., wheelchair or bedside chair)?: A Little Help needed to walk in hospital room?: A Little Help needed climbing 3-5 steps  with a railing? : A Little 6 Click Score: 18    End of Session Equipment Utilized During Treatment: Gait belt;Oxygen Activity Tolerance: Patient limited by fatigue;Patient limited by pain Patient left: in chair;with call bell/phone within reach;with nursing/sitter in room Nurse Communication: Mobility status (Notified nurse of pt desaturation with ambulation) PT Visit Diagnosis: Muscle weakness (generalized) (M62.81);Pain Pain - part of body:  (abdomen)    Time: 8828-0034 PT Time Calculation (min) (ACUTE ONLY): 28 min   Charges:   PT Evaluation $PT Eval Moderate Complexity: 1 Mod PT Treatments $Gait Training: 8-22 mins        Kayelynn Abdou W,PT Acute Rehabilitation Services Pager:  [phone]  Office:  [phone]    Berline Lopes 06/06/2020, 4:19 PM

## 2020-06-06 NOTE — Progress Notes (Signed)
Initial Nutrition Assessment  DOCUMENTATION CODES:   Severe malnutrition in context of chronic illness,Underweight  INTERVENTION:   Tube Feeding via J-tube Initiate Pivot 1.5 at 20 ml/hr; no advancement yet per MD Goal Rate: Pivot 1.5 at 55 ml/hr provides 1980 kcals, 124 g of protein and 1003 mL of free water   NUTRITION DIAGNOSIS:   Severe Malnutrition related to chronic illness as evidenced by severe fat depletion,severe muscle depletion.  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 No prior medical history per chart review. Pt does indicate COVID-19 illness in Feb 2020   12/11 Open splenectomy, distal pancreatectomy, wedge gastrectomy and G-J placement 12/12 Extubated  Pt alert, in pain on visit today.   NG-LIS, G- drain Trickle TF to begin via J-tube  Pt is underweight. Current wt 121 pounds (54.9 kg); pt reports UBW in 150s but pt lost weight when he had COVID-19 in February of 2020. Pt reports when he had COVID-19 he did not get out of bed for 3.5 weeks and experienced vomiting and diarrhea with everything he ate going "out one way or the other." Pt reports he did not require hospitalization during this time but reports that is because he "couldn't afford it."  Pt reports he has gained some of the weight back; reports he weighed as low as 112 pounds and has gained back to around 125-130 pounds. Pt reports he eats 5 small meals per day with great appetite; pt does at one point mention limited funds with requires to food security. Pt reports he sometimes takes a sip of his wife Ensure but does not drink regularly.   Labs:  reviewed Meds: reviewed   NUTRITION - FOCUSED PHYSICAL EXAM:  Flowsheet Row Most Recent Value  Orbital Region Severe depletion  Upper Arm Region Severe depletion  Thoracic and Lumbar Region Severe depletion  Buccal Region Severe depletion  Temple Region Severe depletion  Clavicle Bone Region Severe depletion  Clavicle and Acromion Bone Region Severe depletion  Scapular Bone Region Severe depletion  Dorsal Hand Moderate depletion  Patellar Region Severe depletion  Anterior Thigh Region Severe depletion  Posterior Calf Region Moderate depletion       Diet Order:   Diet Order            Diet NPO time specified Except for: Ice Chips  Diet effective now                 EDUCATION NEEDS:   Education needs have been addressed  Skin:  Skin Assessment: Skin Integrity Issues: Skin Integrity Issues:: Incisions Incisions: abdomen  Last BM:  12/9  Height:   Ht Readings from Last 1 Encounters:  06/04/20 5\' 10"  (1.778 m)    Weight:   Wt Readings from Last 1 Encounters:  06/05/20 54.9 kg   BMI:  Body mass index is 17.37 kg/m.  Estimated Nutritional Needs:   Kcal:  1925-2190 kcals  Protein:  95-110 g  Fluid:  >/= 1.9 L   14/12/21 MS, RDN, LDN, CNSC Registered Dietitian III Clinical Nutrition RD Pager and On-Call Pager Number Located in [place]

## 2020-06-07 ENCOUNTER — Inpatient Hospital Stay (HOSPITAL_COMMUNITY): Payer: Self-pay

## 2020-06-07 LAB — BASIC METABOLIC PANEL
Anion gap: 8 (ref 5–15)
BUN: 5 mg/dL — ABNORMAL LOW (ref 6–20)
CO2: 28 mmol/L (ref 22–32)
Calcium: 7.9 mg/dL — ABNORMAL LOW (ref 8.9–10.3)
Chloride: 96 mmol/L — ABNORMAL LOW (ref 98–111)
Creatinine, Ser: 0.48 mg/dL — ABNORMAL LOW (ref 0.61–1.24)
GFR, Estimated: >60 mL/min (ref >60)
Glucose, Bld: 123 mg/dL — ABNORMAL HIGH (ref 70–99)
Potassium: 3.6 mmol/L (ref 3.5–5.1)
Sodium: 132 mmol/L — ABNORMAL LOW (ref 135–145)

## 2020-06-07 LAB — CBC
HCT: 23.3 % — ABNORMAL LOW (ref 39.0–52.0)
Hemoglobin: 7.8 g/dL — ABNORMAL LOW (ref 13.0–17.0)
MCH: 30.2 pg (ref 26.0–34.0)
MCHC: 33.5 g/dL (ref 30.0–36.0)
MCV: 90.3 fL (ref 80.0–100.0)
Platelets: 242 10*3/uL (ref 150–400)
RBC: 2.58 MIL/uL — ABNORMAL LOW (ref 4.22–5.81)
RDW: 16.2 % — ABNORMAL HIGH (ref 11.5–15.5)
WBC: 19.8 10*3/uL — ABNORMAL HIGH (ref 4.0–10.5)
nRBC: 0.5 % — ABNORMAL HIGH (ref 0.0–0.2)

## 2020-06-07 LAB — GLUCOSE, CAPILLARY
Glucose-Capillary: 117 mg/dL — ABNORMAL HIGH (ref 70–99)
Glucose-Capillary: 118 mg/dL — ABNORMAL HIGH (ref 70–99)
Glucose-Capillary: 125 mg/dL — ABNORMAL HIGH (ref 70–99)
Glucose-Capillary: 128 mg/dL — ABNORMAL HIGH (ref 70–99)
Glucose-Capillary: 131 mg/dL — ABNORMAL HIGH (ref 70–99)
Glucose-Capillary: 139 mg/dL — ABNORMAL HIGH (ref 70–99)

## 2020-06-07 MED ORDER — IOHEXOL 300 MG/ML  SOLN
150.0000 mL | Freq: Once | INTRAMUSCULAR | Status: AC | PRN
  Administered 2020-06-07: 10:00:00 50 mL

## 2020-06-07 MED ORDER — ENOXAPARIN SODIUM 30 MG/0.3ML ~~LOC~~ SOLN
30.0000 mg | SUBCUTANEOUS | Status: DC
  Administered 2020-06-07: 11:00:00 30 mg via SUBCUTANEOUS
  Filled 2020-06-07: qty 0.3

## 2020-06-07 MED ORDER — ENSURE MAX PROTEIN PO LIQD
11.0000 [oz_av] | Freq: Three times a day (TID) | ORAL | Status: DC
  Administered 2020-06-07 – 2020-06-12 (×12): 11 [oz_av] via ORAL
  Filled 2020-06-07 (×17): qty 330

## 2020-06-07 MED ORDER — SODIUM CHLORIDE 0.9 % IV SOLN
12.5000 mg | Freq: Once | INTRAVENOUS | Status: DC
  Filled 2020-06-07: qty 0.5

## 2020-06-07 NOTE — Progress Notes (Signed)
Progress Note  3 Days Post-Op  Subjective: Patient reports he is passing flatus but also having some hiccoughs. Pain with hiccoughs, but trying to stretch out doses of IV pain meds. Craving coffee. Sat up in chair for a while yesterday.   Objective: Vital signs in last 24 hours: Temp:  [97.4 F (36.3 C)-98.4 F (36.9 C)] 97.4 F (36.3 C) (12/14 0805) Pulse Rate:  [68-94] 75 (12/14 0805) Resp:  [10-18] 17 (12/14 0805) BP: (103-133)/(61-81) 120/81 (12/14 0805) SpO2:  [88 %-100 %] 97 % (12/14 0805) Weight:  [56.2 kg-56.5 kg] 56.2 kg (12/14 0509) Last BM Date: 06/02/20  Intake/Output from previous day: 12/13 0701 - 12/14 0700 In: 2059.4 [P.O.:60; I.V.:1640.1; NG/GT:359.3] Out: 2160 [Urine:1775; Emesis/NG output:100; Drains:285] Intake/Output this shift: Total I/O In: -  Out: 525 [Urine:525]  PE: General: pleasant, WD, cachectic male who is laying in bed in NAD Heart: regular, rate, and rhythm.   Lungs: CTAB, no wheezes, rhonchi, or rales noted.  Respiratory effort nonlabored Abd: soft, appropriately ttp, ND, +BS, midline incision with honeycomb dressing present, drain with SS fluid, g-tube to gravity    Lab Results:  Recent Labs    06/06/20 0147 06/06/20 0802 06/07/20 0142  WBC 20.2*  --  19.8*  HGB 6.3* 8.2* 7.8*  HCT 18.9* 24.7* 23.3*  PLT 194  --  242   BMET Recent Labs    06/06/20 0147 06/07/20 0142  NA 136 132*  K 4.2 3.6  CL 100 96*  CO2 27 28  GLUCOSE 112* 123*  BUN 12 5*  CREATININE 0.53* 0.48*  CALCIUM 7.8* 7.9*   PT/INR Recent Labs    06/04/20 1420 06/05/20 0551  LABPROT 15.3* 16.3*  INR 1.3* 1.4*   CMP     Component Value Date/Time   NA 132 (L) 06/07/2020 0142   K 3.6 06/07/2020 0142   CL 96 (L) 06/07/2020 0142   CO2 28 06/07/2020 0142   GLUCOSE 123 (H) 06/07/2020 0142   BUN 5 (L) 06/07/2020 0142   CREATININE 0.48 (L) 06/07/2020 0142   CALCIUM 7.9 (L) 06/07/2020 0142   PROT 4.0 (L) 06/05/2020 0050   ALBUMIN 2.2 (L) 06/05/2020  0050   AST 72 (H) 06/05/2020 0050   ALT 34 06/05/2020 0050   ALKPHOS 42 06/05/2020 0050   BILITOT 1.7 (H) 06/05/2020 0050   GFRNONAA >60 06/07/2020 0142   Lipase  No results found for: LIPASE     Studies/Results: No results found.  Anti-infectives: Anti-infectives (From admission, onward)   Start     Dose/Rate Route Frequency Ordered Stop   06/04/20 2200  piperacillin-tazobactam (ZOSYN) IVPB 3.375 g  Status:  Discontinued        3.375 g 12.5 mL/hr over 240 Minutes Intravenous Every 8 hours 06/04/20 1450 06/05/20 0654   06/04/20 2030  metroNIDAZOLE (FLAGYL) IVPB 500 mg        500 mg 100 mL/hr over 60 Minutes Intravenous STAT 06/04/20 2018 06/04/20 2057   06/04/20 1500  piperacillin-tazobactam (ZOSYN) IVPB 3.375 g        3.375 g 100 mL/hr over 30 Minutes Intravenous  Once 06/04/20 1448 06/04/20 1547   06/04/20 1445  cefTRIAXone (ROCEPHIN) 2 g in sodium chloride 0.9 % 100 mL IVPB  Status:  Discontinued        2 g 200 mL/hr over 30 Minutes Intravenous Every 24 hours 06/04/20 1432 06/04/20 1447       Assessment/Plan s/p open splenectomy, distal pancreatectomy, wedge gastrectomy and G-J tube placement  on 12/11 for bleeding pancreatic mass that was invading the posterior wall of the stomach and splenic hilum.  Bleeding pancreatic mass s/p sx as above - UGI today to evaluate for leak - ok to clamp G-tube, will plan for removal of NGT if UGI negative for leak - PPI BID - surgical path pending  - tol trickle TF per J tube  Acute blood loss anemia - 2 units pRBC at outside ED, 3 units prbc and 2 units FFP in OR, 1u PRBC 12/13 - Hgb 7.8 from 8.2 this AM, ok to start lovenox today   Acute hypoxicrespiratory failure (postoperative) - Extubated and doing well  Leukocytosis - post op and post splenectomy, expect to be elevated  FEN: IVF, trickle TF, possibly start some PO pending UGI ID: rocephin/flagyl 12/11; Zosyn 12/11>12/12 VTE: SCDs, start lovenox today  Foley:  removed Dispo: UGI today   LOS: 3 days    Juliet Rude , [facility] 06/07/2020, 8:51 AM Please see Amion for pager number during day hours 7:00am-4:30pm

## 2020-06-07 NOTE — Progress Notes (Addendum)
Nutrition Follow Up  DOCUMENTATION CODES:   Severe malnutrition in context of chronic illness,Underweight  INTERVENTION:   Given patient had large wedge gastrectomy similar to sleeve gastrectomy, concern patient will not be able to consume enough volume by mouth to maintain or progress body weight and improve nutrition status.   Tube Feeding via J-tube Pivot 1.5 at 20 ml/hr; no advancement yet per MD Goal Rate: Pivot 1.5 at 55 ml/hr provides 1980 kcals, 124 g of protein and 1003 mL of free water  Add Ensure Enlive po TID, each supplement provides 350 kcal and 20 grams of protein  NUTRITION DIAGNOSIS:   Severe Malnutrition related to chronic illness as evidenced by severe fat depletion,severe muscle depletion.  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 No prior medical history per chart review. Pt does indicate COVID-19 illness in Feb 2020   12/11 Open splenectomy, distal pancreatectomy, wedge gastrectomy and G-J placement 12/12 Extubated  NG removed. UGI unremarkable. Diet advanced to full liquids. Tolerating Pivot 1.5 @ 20 ml/hr. No BM x 3 days.   Pt reports he is eager to eat. Discussed increased need for protein and kcal via supplementation. Pt expresses that his wife uses Ensure at home and he sometimes drinks them as well. States he often uses EBT to purchase these. Concern that patient will be unable to purchase amount of needed supplements to meet daily kcal/protein needs.   Concern pt will be unlikely to meet nutrition needs by mouth only. Given severity of malnutrition and ongoing weight loss, pt  would benefit from continuing TF at home along with PO as he will be on a liquid diet for 2 weeks. Discussed case with MD, plan to keep TF at tickle rate today and see how he does with PO.    Admission weight: 54.9 kg  Current weight: 56.2 kg   UOP: 1775 ml x 24 hrs JP drain: 60 ml x 24 hrs  G-tube: 225 ml x 24 hrs   Medications: reviewed  Labs: Na 132 (L) CBG 118-139  Diet Order:   Diet Order            Diet clear liquid Room service appropriate? Yes; Fluid consistency: Thin  Diet effective now                 EDUCATION NEEDS:   Education needs have been addressed  Skin:  Skin Assessment: Skin Integrity Issues: Skin Integrity Issues:: Incisions Incisions: abdomen  Last BM:  12/9  Height:   Ht Readings from Last 1 Encounters:  06/06/20 5\' 10"  (1.778 m)    Weight:   Wt Readings from Last 1 Encounters:  06/07/20 56.2 kg   BMI:  Body mass index is 17.78 kg/m.  Estimated Nutritional Needs:   Kcal:  1925-2190 kcals  Protein:  95-110 g  Fluid:  >/= 1.9 L  06/09/20 RD, LDN Clinical Nutrition Pager listed in AMION

## 2020-06-08 LAB — BPAM RBC
Blood Product Expiration Date: 202201082359
Blood Product Expiration Date: 202201092359
Blood Product Expiration Date: 202201112359
Blood Product Expiration Date: 202201112359
Blood Product Expiration Date: 202201112359
Blood Product Expiration Date: 202201112359
ISSUE DATE / TIME: 202112111922
ISSUE DATE / TIME: 202112111922
ISSUE DATE / TIME: 202112111922
ISSUE DATE / TIME: 202112112117
ISSUE DATE / TIME: 202112120922
ISSUE DATE / TIME: 202112130320
Unit Type and Rh: 5100
Unit Type and Rh: 5100
Unit Type and Rh: 5100
Unit Type and Rh: 5100
Unit Type and Rh: 6200
Unit Type and Rh: 6200

## 2020-06-08 LAB — TYPE AND SCREEN
ABO/RH(D): A POS
Antibody Screen: NEGATIVE
Unit division: 0
Unit division: 0
Unit division: 0
Unit division: 0
Unit division: 0
Unit division: 0

## 2020-06-08 LAB — CBC
HCT: 24.2 % — ABNORMAL LOW (ref 39.0–52.0)
Hemoglobin: 8.3 g/dL — ABNORMAL LOW (ref 13.0–17.0)
MCH: 31 pg (ref 26.0–34.0)
MCHC: 34.3 g/dL (ref 30.0–36.0)
MCV: 90.3 fL (ref 80.0–100.0)
Platelets: 378 10*3/uL (ref 150–400)
RBC: 2.68 MIL/uL — ABNORMAL LOW (ref 4.22–5.81)
RDW: 15.9 % — ABNORMAL HIGH (ref 11.5–15.5)
WBC: 14.2 10*3/uL — ABNORMAL HIGH (ref 4.0–10.5)
nRBC: 1.1 % — ABNORMAL HIGH (ref 0.0–0.2)

## 2020-06-08 LAB — BASIC METABOLIC PANEL
Anion gap: 9 (ref 5–15)
BUN: <5 mg/dL — ABNORMAL LOW (ref 6–20)
CO2: 29 mmol/L (ref 22–32)
Calcium: 8.4 mg/dL — ABNORMAL LOW (ref 8.9–10.3)
Chloride: 96 mmol/L — ABNORMAL LOW (ref 98–111)
Creatinine, Ser: 0.49 mg/dL — ABNORMAL LOW (ref 0.61–1.24)
GFR, Estimated: >60 mL/min (ref >60)
Glucose, Bld: 124 mg/dL — ABNORMAL HIGH (ref 70–99)
Potassium: 3.8 mmol/L (ref 3.5–5.1)
Sodium: 134 mmol/L — ABNORMAL LOW (ref 135–145)

## 2020-06-08 LAB — GLUCOSE, CAPILLARY
Glucose-Capillary: 103 mg/dL — ABNORMAL HIGH (ref 70–99)
Glucose-Capillary: 118 mg/dL — ABNORMAL HIGH (ref 70–99)
Glucose-Capillary: 121 mg/dL — ABNORMAL HIGH (ref 70–99)
Glucose-Capillary: 121 mg/dL — ABNORMAL HIGH (ref 70–99)
Glucose-Capillary: 129 mg/dL — ABNORMAL HIGH (ref 70–99)
Glucose-Capillary: 131 mg/dL — ABNORMAL HIGH (ref 70–99)

## 2020-06-08 MED ORDER — NICOTINE 14 MG/24HR TD PT24
14.0000 mg | MEDICATED_PATCH | Freq: Every day | TRANSDERMAL | Status: DC
  Administered 2020-06-08 – 2020-06-12 (×5): 14 mg via TRANSDERMAL
  Filled 2020-06-08 (×5): qty 1

## 2020-06-08 MED ORDER — OSMOLITE 1.5 CAL PO LIQD
1000.0000 mL | ORAL | Status: DC
  Administered 2020-06-08: 15:00:00 1000 mL
  Filled 2020-06-08 (×2): qty 1000

## 2020-06-08 MED ORDER — PROSOURCE TF PO LIQD
45.0000 mL | Freq: Three times a day (TID) | ORAL | Status: DC
  Administered 2020-06-08 – 2020-06-12 (×10): 45 mL
  Filled 2020-06-08 (×14): qty 45

## 2020-06-08 MED ORDER — ENOXAPARIN SODIUM 40 MG/0.4ML ~~LOC~~ SOLN
40.0000 mg | SUBCUTANEOUS | Status: DC
  Administered 2020-06-08 – 2020-06-12 (×5): 40 mg via SUBCUTANEOUS
  Filled 2020-06-08 (×5): qty 0.4

## 2020-06-08 NOTE — Progress Notes (Signed)
SATURATION QUALIFICATIONS: (This note is used to comply with regulatory documentation for home oxygen)  Patient Saturations on Room Air at Rest = 88%  Patient Saturations on Room Air while Ambulating = 83%  Patient Saturations on 3 Liters of oxygen while Ambulating = 91%  Please briefly explain why patient needs home oxygen:Pt desats on RA at rest and with activity. Needed 1LO2 at rest and 3LO2 with activity to maintain sats. Merita Hawks W,PT Acute Rehabilitation Services Pager:  [phone]  Office:  [phone]

## 2020-06-08 NOTE — Progress Notes (Signed)
Starting osmolite 1.5 tube feeding at 43ml/hr per order. Pt tolerated well.  Lawson Radar, RN

## 2020-06-08 NOTE — Progress Notes (Signed)
Progress Note  4 Days Post-Op  Subjective: Feeling down this morning Passing flatus, having bowel movements. Tolerating liquid diet and ensures   Objective: Vital signs in last 24 hours: Temp:  [98 F (36.7 C)-98.4 F (36.9 C)] 98.3 F (36.8 C) (12/15 0756) Pulse Rate:  [65-72] 71 (12/15 0756) Resp:  [15-18] 15 (12/15 0756) BP: (107-130)/(72-79) 121/77 (12/15 0756) SpO2:  [96 %-98 %] 96 % (12/15 0756) Weight:  [61.3 kg] 61.3 kg (12/15 0543) Last BM Date: 06/07/20  Intake/Output from previous day: 12/14 0701 - 12/15 0700 In: 590 [P.O.:250; NG/GT:340] Out: 2700 [Urine:2325; Drains:375] Intake/Output this shift: No intake/output data recorded.  PE: General: pleasant, WD, cachectic male who is laying in bed in NAD Heart: regular, rate, and rhythm.   Lungs: CTAB, no wheezes, rhonchi, or rales noted.  Respiratory effort nonlabored Abd: soft, appropriately ttp, ND, +BS, midline incision with honeycomb dressing present, drain with SS fluid, g-tube in place    Lab Results:  Recent Labs    06/07/20 0142 06/08/20 0124  WBC 19.8* 14.2*  HGB 7.8* 8.3*  HCT 23.3* 24.2*  PLT 242 378   BMET Recent Labs    06/07/20 0142 06/08/20 0124  NA 132* 134*  K 3.6 3.8  CL 96* 96*  CO2 28 29  GLUCOSE 123* 124*  BUN 5* <5*  CREATININE 0.48* 0.49*  CALCIUM 7.9* 8.4*   PT/INR No results for input(s): LABPROT, INR in the last 72 hours. CMP     Component Value Date/Time   NA 134 (L) 06/08/2020 0124   K 3.8 06/08/2020 0124   CL 96 (L) 06/08/2020 0124   CO2 29 06/08/2020 0124   GLUCOSE 124 (H) 06/08/2020 0124   BUN <5 (L) 06/08/2020 0124   CREATININE 0.49 (L) 06/08/2020 0124   CALCIUM 8.4 (L) 06/08/2020 0124   PROT 4.0 (L) 06/05/2020 0050   ALBUMIN 2.2 (L) 06/05/2020 0050   AST 72 (H) 06/05/2020 0050   ALT 34 06/05/2020 0050   ALKPHOS 42 06/05/2020 0050   BILITOT 1.7 (H) 06/05/2020 0050   GFRNONAA >60 06/08/2020 0124   Lipase  No results found for:  LIPASE     Studies/Results: DG UGI W SINGLE CM (SOL OR THIN BA)  Result Date: 06/07/2020 CLINICAL DATA:  Status post splenectomy, distal pancreatectomy and partial gastrectomy 06/04/2020 for apparent hemorrhagic left upper quadrant mass. Request to evaluate for gastric leak via NG tube. EXAM: UPPER GI SERIES WITH KUB TECHNIQUE: After obtaining a scout radiograph a routine upper GI series was performed using water-soluble iodinated contrast. FLUOROSCOPY TIME:  Fluoroscopy Time:  1 minutes 54 seconds Radiation Exposure Index (if provided by the fluoroscopic device): 41 mGy Number of Acquired Spot Images: 7 COMPARISON:  06/04/2020 CT abdomen/pelvis. FINDINGS: Scout radiograph demonstrates midline skin staples in the abdomen and surgical suture lines throughout the left upper quadrant. Percutaneous gastrojejunostomy tube noted with tip in the medial right abdomen. Surgical drain terminates in the splenectomy bed. NG tube terminates in remnant stomach. No dilated small bowel loops. Mild colonic stool. No evidence of pneumatosis or pneumoperitoneum. Water-soluble contrast injected into the remnant stomach through the NG tube fills the lumen of the remnant stomach, with no evidence of gastric leak. Water-soluble contrast transits into the duodenum and proximal jejunum without delay. No fold thickening or filling defects in the nondistended remnant stomach or duodenum. IMPRESSION: 1. Satisfactory postoperative appearance status post partial gastrectomy, with no evidence of gastric leak. 2. Normal gastric emptying with transit of contrast into  the jejunum. 3. No evidence of bowel obstruction on scout radiograph. Electronically Signed   By: Delbert Phenix M.D.   On: 06/07/2020 11:02    Anti-infectives: Anti-infectives (From admission, onward)   Start     Dose/Rate Route Frequency Ordered Stop   06/04/20 2200  piperacillin-tazobactam (ZOSYN) IVPB 3.375 g  Status:  Discontinued        3.375 g 12.5 mL/hr over 240  Minutes Intravenous Every 8 hours 06/04/20 1450 06/05/20 0654   06/04/20 2030  metroNIDAZOLE (FLAGYL) IVPB 500 mg        500 mg 100 mL/hr over 60 Minutes Intravenous STAT 06/04/20 2018 06/04/20 2057   06/04/20 1500  piperacillin-tazobactam (ZOSYN) IVPB 3.375 g        3.375 g 100 mL/hr over 30 Minutes Intravenous  Once 06/04/20 1448 06/04/20 1547   06/04/20 1445  cefTRIAXone (ROCEPHIN) 2 g in sodium chloride 0.9 % 100 mL IVPB  Status:  Discontinued        2 g 200 mL/hr over 30 Minutes Intravenous Every 24 hours 06/04/20 1432 06/04/20 1447       Assessment/Plan s/p open splenectomy, distal pancreatectomy, wedge gastrectomy and G-J tube placement on 12/11 for bleeding pancreatic mass that was invading the posterior wall of the stomach and splenic hilum.  Bleeding pancreatic mass s/p sx as above - UGI 12/14 negative for leak - Clamp G tube and vent if patient having nausea/vomiting - PPI BID - surgical path pending  - advance tube feeds to goal - Plan for nocturnal tube feeds at discharge - Full liquid diet for 2 weeks at discharge - Ensure max - goal 3 per day - post-splenectomy vaccines on day of discharge  Acute blood loss anemia  - 2 units pRBC at outside ED, 3 units prbc and 2 units FFP in OR, 1u PRBC 12/13 - Hgb stable this AM  Acute hypoxicrespiratory failure (postoperative) - Extubated and doing well, resolved Leukocytosis - post op and post splenectomy, expect to be elevated  FEN: IVF, trickle TF, possibly start some PO pending UGI ID: rocephin/flagyl 12/11; Zosyn 12/11>12/12 VTE: SCDs, start lovenox today  Foley: removed Dispo: UGI today   LOS: 4 days    Quentin Ore , [facility] 06/08/2020, 8:30 AM Please see Amion for pager number during day hours 7:00am-4:30pm

## 2020-06-08 NOTE — Progress Notes (Addendum)
Nutrition Follow Up  DOCUMENTATION CODES:   Severe malnutrition in context of chronic illness,Underweight  INTERVENTION:   Transition to standard formula: -Osmolite 1.5 @ 30 ml/hr via J-tube -Increase by 10 ml Q4 hours to goal rate of 55 ml/hr (1320 ml) -ProSource TF 45 ml TID  Provides: 2100 kcals, 116 grams protein, 1006 ml free water.   Tomorrow transition to nocturnal feedings:  -Osmolite 1.5 @ 85 ml/hr x 14 hrs (1700-0700) via J-tube -ProSource TF 45 ml TID  Provides: 1905 kcals, 108 grams protein, 907 ml free water.   Continue Ensure Enlive po TID, each supplement provides 150 kcal and 30 grams of protein  NUTRITION DIAGNOSIS:   Severe Malnutrition related to chronic illness as evidenced by severe fat depletion,severe muscle depletion.  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 No prior medical history per chart review. Pt does indicate COVID-19 illness in Feb 2020   12/11 Open splenectomy, distal pancreatectomy, wedge gastrectomy and G-J placement 12/12 Extubated 12/14- UGI negative for leak  Plan to clamp G-tube and vent if patient feels nauseous. Had BM yesterday. Continues to tolerate Pivot 1.5 @ 20 ml/hr. Doing okay with full liquids. Had 1-2 Ensure MAX.   Ensure MAX is high protein low calorie intended for weight loss with muscle retention. Patient would benefit more from high calorie high protein supplement if there is no concern for dumping syndrome (seems pylorus is still present).   Transition to standard formula and titrate to goal. Will leave continuous for today  to monitor for signs of refeeding. Plan transition to nocturnal feedings tomorrow. Patient endorses he does not have insurance and fears he will not be able to afford tube feeding. Will reach out to case management/social work to discuss d/c plan.   Admission weight: 54.9 kg  Current weight: 61.3 kg   UOP: 2325 ml x 24 hrs JP drain: 50 ml x 24 hrs  G-tube: 325 ml x 24 hrs   Medications: reviewed  Labs: Na 134 (L) CBG 117-131  Diet Order:   Diet Order            Diet full liquid Room service appropriate? Yes; Fluid consistency: Thin  Diet effective now                 EDUCATION NEEDS:   Education needs have been addressed  Skin:  Skin Assessment: Skin Integrity Issues: Skin Integrity Issues:: Incisions Incisions: abdomen  Last BM:  12/14  Height:   Ht Readings from Last 1 Encounters:  06/06/20 5\' 10"  (1.778 m)    Weight:   Wt Readings from Last 1 Encounters:  06/08/20 61.3 kg   BMI:  Body mass index is 19.39 kg/m.  Estimated Nutritional Needs:   Kcal:  1925-2190 kcals  Protein:  95-110 g  Fluid:  >/= 1.9 L  06/10/20 RD, LDN Clinical Nutrition Pager listed in AMION

## 2020-06-08 NOTE — Progress Notes (Signed)
Advanced tube feeding to 62ml/hr. Pt tolerated well.   Lawson Radar, RN

## 2020-06-08 NOTE — Progress Notes (Signed)
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    PT Comments    Pt admitted with above diagnosis. Pt was able to ambulate on unit with makes good progress each day.  Incr distance today.  Good stability with RW as well. Does need O2 to maintain saturations currently.  Pt currently with functional limitations due to balance and endurance deficits. Pt will benefit from skilled PT to increase their independence and safety with mobility to allow discharge to the venue listed below.    SATURATION QUALIFICATIONS: (This note is used to comply with regulatory documentation for home oxygen)  Patient Saturations on Room Air at Rest = 88%  Patient Saturations on Room Air while Ambulating = 83%  Patient Saturations on 3 Liters of oxygen while Ambulating = 91%  Please briefly explain why patient needs home oxygen:Pt desats on RA at rest and with activity. Needed 1LO2 at rest and 3LO2 with activity to maintain sats.   Follow Up Recommendations  Home health PT;Supervision/Assistance - 24 hour (may progress to no HHPT)     Equipment Recommendations  Rolling walker with 5" wheels;3in1 (PT) (may not need equipment depending on progress)    Recommendations for Other Services       Precautions / Restrictions Precautions Precautions: Fall Precaution Comments: G-tube, JP drain, NG tube Restrictions Weight Bearing Restrictions: No    Mobility  Bed Mobility Overal bed mobility: Needs Assistance Bed Mobility: Rolling;Sidelying to Sit Rolling: Supervision Sidelying to sit: Supervision          Transfers Overall transfer level: Needs assistance Equipment used:  Rolling walker (2 wheeled) Transfers: Sit to/from Stand Sit to Stand: Supervision            Ambulation/Gait Ambulation/Gait assistance: Supervision Gait Distance (Feet): 450 Feet Assistive device: Rolling walker (2 wheeled) Gait Pattern/deviations: Step-through pattern;Decreased stride length;Trunk flexed   Gait velocity interpretation: <1.31 ft/sec, indicative of household ambulator General Gait Details: Pt maintains slightly flexed posture due to sore abdomen but was more successful in achieving fully upright position.  Gait steady with RW.   Stairs             Wheelchair Mobility    Modified Rankin (Stroke Patients Only)       Balance Overall balance assessment: Needs assistance Sitting-balance support: No upper extremity supported;Feet supported Sitting balance-Leahy Scale: Fair     Standing balance support: Bilateral upper extremity supported;During functional activity Standing balance-Leahy Scale: Poor Standing balance comment: relies on UE support for balance on RW                            Cognition Arousal/Alertness: Awake/alert Behavior During Therapy: WFL for tasks assessed/performed Overall Cognitive Status: Within Functional Limits for tasks assessed                                        Exercises      General Comments General comments (skin integrity, edema, etc.): VSS with pt ambulating on 3LO2 to keep sats >90%.  Pt was on 1L at rest.  Desat to  83% on RA.      Pertinent Vitals/Pain Pain Assessment: Faces Faces Pain Scale: Hurts little more Pain Location: incision Pain Descriptors / Indicators: Cramping Pain Intervention(s): Limited activity within patient's tolerance;Monitored during session;Repositioned    Home Living                      Prior Function            PT Goals (current goals can now be found in the care plan section) Acute Rehab PT Goals Patient Stated Goal: to  gohome Progress towards PT goals: Progressing toward goals    Frequency    Min 3X/week      PT Plan Current plan remains appropriate    Co-evaluation              AM-PAC PT "6 Clicks" Mobility   Outcome Measure  Help needed turning from your back to your side while in a flat bed without using bedrails?: A Little Help needed moving from lying on your back to sitting on the side of a flat bed without using bedrails?: A Little Help needed moving to and from a bed to a chair (including a wheelchair)?: A Little Help needed standing up from a chair using your arms (e.g., wheelchair or bedside chair)?: A Little Help needed to walk in hospital room?: A Little Help needed climbing 3-5 steps with a railing? : A Little 6 Click Score: 18    End of Session Equipment Utilized During Treatment: Gait belt;Oxygen Activity Tolerance: Patient limited by fatigue;Patient limited by pain Patient left: with call bell/phone within reach;in bed Nurse Communication: Mobility status PT Visit Diagnosis: Muscle weakness (generalized) (M62.81);Pain Pain - part of body:  (abdomen)     Time: 8099-8338 PT Time Calculation (min) (ACUTE ONLY): 23 min  Charges:  $Gait Training: 23-37 mins                     Eddie Morrow W,PT Acute Rehabilitation Services Pager:  [phone]  Office:  [phone]     Eddie Morrow 06/08/2020, 1:45 PM

## 2020-06-09 LAB — GLUCOSE, CAPILLARY
Glucose-Capillary: 157 mg/dL — ABNORMAL HIGH (ref 70–99)
Glucose-Capillary: 142 mg/dL — ABNORMAL HIGH (ref 70–99)
Glucose-Capillary: 148 mg/dL — ABNORMAL HIGH (ref 70–99)
Glucose-Capillary: 102 mg/dL — ABNORMAL HIGH (ref 70–99)
Glucose-Capillary: 156 mg/dL — ABNORMAL HIGH (ref 70–99)
Glucose-Capillary: 154 mg/dL — ABNORMAL HIGH (ref 70–99)

## 2020-06-09 LAB — BASIC METABOLIC PANEL
Anion gap: 10 (ref 5–15)
BUN: 8 mg/dL (ref 6–20)
CO2: 23 mmol/L (ref 22–32)
Calcium: 8.1 mg/dL — ABNORMAL LOW (ref 8.9–10.3)
Chloride: 98 mmol/L (ref 98–111)
Creatinine, Ser: 0.39 mg/dL — ABNORMAL LOW (ref 0.61–1.24)
GFR, Estimated: >60 mL/min (ref >60)
Glucose, Bld: 132 mg/dL — ABNORMAL HIGH (ref 70–99)
Potassium: 3.9 mmol/L (ref 3.5–5.1)
Sodium: 131 mmol/L — ABNORMAL LOW (ref 135–145)

## 2020-06-09 LAB — CBC
HCT: 23.5 % — ABNORMAL LOW (ref 39.0–52.0)
Hemoglobin: 8 g/dL — ABNORMAL LOW (ref 13.0–17.0)
MCH: 30.9 pg (ref 26.0–34.0)
MCHC: 34 g/dL (ref 30.0–36.0)
MCV: 90.7 fL (ref 80.0–100.0)
Platelets: 473 10*3/uL — ABNORMAL HIGH (ref 150–400)
RBC: 2.59 MIL/uL — ABNORMAL LOW (ref 4.22–5.81)
RDW: 15.8 % — ABNORMAL HIGH (ref 11.5–15.5)
WBC: 13.6 10*3/uL — ABNORMAL HIGH (ref 4.0–10.5)
nRBC: 1.5 % — ABNORMAL HIGH (ref 0.0–0.2)

## 2020-06-09 LAB — CULTURE, BLOOD (ROUTINE X 2)
Culture: NO GROWTH
Culture: NO GROWTH

## 2020-06-09 LAB — SURGICAL PATHOLOGY

## 2020-06-09 MED ORDER — OXYCODONE HCL 5 MG PO TABS
5.0000 mg | ORAL_TABLET | ORAL | Status: DC | PRN
  Administered 2020-06-09 – 2020-06-12 (×6): 10 mg via ORAL
  Filled 2020-06-09 (×7): qty 2

## 2020-06-09 MED ORDER — DOCUSATE SODIUM 100 MG PO CAPS: 100.0000 mg | ORAL_CAPSULE | Freq: Every day | ORAL | Status: DC | PRN

## 2020-06-09 MED ORDER — OSMOLITE 1.5 CAL PO LIQD
1000.0000 mL | ORAL | Status: DC
  Administered 2020-06-09 – 2020-06-11 (×2): 1000 mL
  Filled 2020-06-09 (×7): qty 1000

## 2020-06-09 MED ORDER — ACETAMINOPHEN 325 MG PO TABS: 650.0000 mg | ORAL_TABLET | Freq: Three times a day (TID) | ORAL | Status: DC

## 2020-06-09 MED ORDER — GUAIFENESIN ER 600 MG PO TB12
600.0000 mg | ORAL_TABLET | Freq: Every day | ORAL | Status: DC
  Administered 2020-06-09 – 2020-06-11 (×3): 600 mg via ORAL
  Filled 2020-06-09 (×3): qty 1

## 2020-06-09 MED ORDER — IPRATROPIUM-ALBUTEROL 0.5-2.5 (3) MG/3ML IN SOLN
3.0000 mL | Freq: Two times a day (BID) | RESPIRATORY_TRACT | Status: DC
  Administered 2020-06-09 – 2020-06-10 (×4): 3 mL via RESPIRATORY_TRACT
  Filled 2020-06-09 (×4): qty 3

## 2020-06-09 MED ORDER — SODIUM CHLORIDE 0.9 % IV SOLN: 250.0000 mL | INTRAVENOUS | Status: DC | PRN

## 2020-06-09 MED ORDER — METHOCARBAMOL 500 MG PO TABS
500.0000 mg | ORAL_TABLET | Freq: Three times a day (TID) | ORAL | Status: DC
  Administered 2020-06-09 – 2020-06-10 (×4): 500 mg via ORAL
  Filled 2020-06-09 (×4): qty 1

## 2020-06-09 MED ORDER — PANTOPRAZOLE SODIUM 40 MG PO TBEC
40.0000 mg | DELAYED_RELEASE_TABLET | Freq: Two times a day (BID) | ORAL | Status: DC
  Administered 2020-06-09 – 2020-06-12 (×6): 40 mg via ORAL
  Filled 2020-06-09 (×6): qty 1

## 2020-06-09 MED ORDER — HYDROMORPHONE HCL 1 MG/ML IJ SOLN
0.5000 mg | INTRAMUSCULAR | Status: DC | PRN
  Administered 2020-06-09 – 2020-06-10 (×4): 0.5 mg via INTRAVENOUS
  Filled 2020-06-09 (×4): qty 1

## 2020-06-09 MED ORDER — ACETAMINOPHEN 325 MG PO TABS
650.0000 mg | ORAL_TABLET | Freq: Four times a day (QID) | ORAL | Status: DC
  Administered 2020-06-09 – 2020-06-12 (×7): 650 mg via ORAL
  Filled 2020-06-09 (×9): qty 2

## 2020-06-09 NOTE — TOC Initial Note (Signed)
Transition of Care (TOC) - Initial/Assessment Note  Donn Pierini RN, BSN Transitions of Care Unit 4E- RN Case Manager See Treatment Team for direct phone #    Patient Details  Name: Eddie Morrow MRN: [id number] Date of Birth: 1971/03/30  Transition of Care [facility]) CM/SW Contact:    Darrold Span, RN Phone Number: 06/09/2020, 2:00 PM  Clinical Narrative:                 Referral received for nocturnal tube feeding needs. As pt is un-insured call made to [facility] for charity HH/tube feed needs. Referral discussed with Ian Malkin with [employer] who will f/u to see if services can be provided/ 1545- received call back that [facility] had given approval to review referral for nocturnal feedings on pt- referral given and Ian Malkin will follow up with pt regarding home tube feeding needs under charity program.   TOC to f/u prior to discharge for further transition needs.    Expected Discharge Plan: Home w Home Health Services Barriers to Discharge: Continued Medical Work up   Patient Goals and CMS Choice Patient states their goals for this hospitalization and ongoing recovery are:: return home CMS Medicare.gov Compare Post Acute Care list provided to:: Patient Choice offered to / list presented to : NA (charity care)  Expected Discharge Plan and Services Expected Discharge Plan: Home w Home Health Services     Post Acute Care Choice: Durable Medical Equipment,Home Health Living arrangements for the past 2 months: Single Family Home                 DME Arranged: Tube feeding DME Agency: [facility] Date DME Agency Contacted: 06/09/20 Time DME Agency Contacted: 1530 Representative spoke with at DME Agency: Ian Malkin            Prior Living Arrangements/Services Living arrangements for the past 2 months: Single Family Home Lives with:: Spouse Patient language and need for interpreter reviewed:: Yes        Need for Family Participation in Patient Care: Yes (Comment) Care giver  support system in place?: Yes (comment)   Criminal Activity/Legal Involvement Pertinent to Current Situation/Hospitalization: No - Comment as needed  Activities of Daily Living Home Assistive Devices/Equipment: None ADL Screening (condition at time of admission) Patient's cognitive ability adequate to safely complete daily activities?: Yes Is the patient deaf or have difficulty hearing?: No Does the patient have difficulty seeing, even when wearing glasses/contacts?: No Does the patient have difficulty concentrating, remembering, or making decisions?: No Patient able to express need for assistance with ADLs?: Yes Does the patient have difficulty dressing or bathing?: No Independently performs ADLs?: Yes (appropriate for developmental age) Does the patient have difficulty walking or climbing stairs?: No Weakness of Legs: None Weakness of Arms/Hands: None  Permission Sought/Granted                  Emotional Assessment Appearance:: Appears stated age Attitude/Demeanor/Rapport: Engaged Affect (typically observed): Appropriate Orientation: : Oriented to Self,Oriented to Place,Oriented to  Time,Oriented to Situation   Psych Involvement: No (comment)  Admission diagnosis:  Intraperitoneal hemorrhage [K66.1] Laceration of spleen, initial encounter [S36.039A] Status post surgery [Z98.890] Ruptured spleen [S36.09XA] Patient Active Problem List   Diagnosis Date Noted  . Protein-calorie malnutrition, severe 06/06/2020  . Status post surgery 06/04/2020  . Ruptured spleen 06/04/2020   PCP:  Patient, No Pcp Per Pharmacy:   [facility] [phone] - [place], [place] - [address] AT [facility] [address] [place] [address] Phone: [phone] Fax: [phone]     Social Determinants of Health (SDOH) Interventions    Readmission Risk Interventions No flowsheet data found.

## 2020-06-09 NOTE — Progress Notes (Signed)
Progress Note  5 Days Post-Op  Subjective: Patient reports gas pains after eating but this is improving some. He is having bowel function and tolerating FLD. Tolerating TF at goal. He reports some SOB after getting up to restroom and then getting back to bed and getting settled.   Objective: Vital signs in last 24 hours: Temp:  [98.2 F (36.8 C)-98.4 F (36.9 C)] 98.4 F (36.9 C) (12/16 0342) Pulse Rate:  [72-83] 81 (12/16 0342) Resp:  [14-16] 15 (12/16 0342) BP: (96-114)/(57-85) 102/62 (12/16 0342) SpO2:  [91 %-95 %] 94 % (12/16 0342) Last BM Date: 06/08/20  Intake/Output from previous day: 12/15 0701 - 12/16 0700 In: 1550 [P.O.:1070; NG/GT:480] Out: 2205 [Urine:2075; Drains:130] Intake/Output this shift: No intake/output data recorded.  PE: General: pleasant, WD, cachectic male who is laying in bed in NAD Heart: regular, rate, and rhythm.   Lungs: CTAB, no wheezes, rhonchi, or rales noted.  Respiratory effort nonlabored Abd: soft, appropriately ttp, ND, +BS, midline incision c/d/i with staples present, drain with SS fluid, g-tube in place   Lab Results:  Recent Labs    06/08/20 0124 06/09/20 0103  WBC 14.2* 13.6*  HGB 8.3* 8.0*  HCT 24.2* 23.5*  PLT 378 473*   BMET Recent Labs    06/08/20 0124 06/09/20 0103  NA 134* 131*  K 3.8 3.9  CL 96* 98  CO2 29 23  GLUCOSE 124* 132*  BUN <5* 8  CREATININE 0.49* 0.39*  CALCIUM 8.4* 8.1*   PT/INR No results for input(s): LABPROT, INR in the last 72 hours. CMP     Component Value Date/Time   NA 131 (L) 06/09/2020 0103   K 3.9 06/09/2020 0103   CL 98 06/09/2020 0103   CO2 23 06/09/2020 0103   GLUCOSE 132 (H) 06/09/2020 0103   BUN 8 06/09/2020 0103   CREATININE 0.39 (L) 06/09/2020 0103   CALCIUM 8.1 (L) 06/09/2020 0103   PROT 4.0 (L) 06/05/2020 0050   ALBUMIN 2.2 (L) 06/05/2020 0050   AST 72 (H) 06/05/2020 0050   ALT 34 06/05/2020 0050   ALKPHOS 42 06/05/2020 0050   BILITOT 1.7 (H) 06/05/2020 0050    GFRNONAA >60 06/09/2020 0103   Lipase  No results found for: LIPASE     Studies/Results: DG UGI W SINGLE CM (SOL OR THIN BA)  Result Date: 06/07/2020 CLINICAL DATA:  Status post splenectomy, distal pancreatectomy and partial gastrectomy 06/04/2020 for apparent hemorrhagic left upper quadrant mass. Request to evaluate for gastric leak via NG tube. EXAM: UPPER GI SERIES WITH KUB TECHNIQUE: After obtaining a scout radiograph a routine upper GI series was performed using water-soluble iodinated contrast. FLUOROSCOPY TIME:  Fluoroscopy Time:  1 minutes 54 seconds Radiation Exposure Index (if provided by the fluoroscopic device): 41 mGy Number of Acquired Spot Images: 7 COMPARISON:  06/04/2020 CT abdomen/pelvis. FINDINGS: Scout radiograph demonstrates midline skin staples in the abdomen and surgical suture lines throughout the left upper quadrant. Percutaneous gastrojejunostomy tube noted with tip in the medial right abdomen. Surgical drain terminates in the splenectomy bed. NG tube terminates in remnant stomach. No dilated small bowel loops. Mild colonic stool. No evidence of pneumatosis or pneumoperitoneum. Water-soluble contrast injected into the remnant stomach through the NG tube fills the lumen of the remnant stomach, with no evidence of gastric leak. Water-soluble contrast transits into the duodenum and proximal jejunum without delay. No fold thickening or filling defects in the nondistended remnant stomach or duodenum. IMPRESSION: 1. Satisfactory postoperative appearance status post partial  gastrectomy, with no evidence of gastric leak. 2. Normal gastric emptying with transit of contrast into the jejunum. 3. No evidence of bowel obstruction on scout radiograph. Electronically Signed   By: Delbert Phenix M.D.   On: 06/07/2020 11:02    Anti-infectives: Anti-infectives (From admission, onward)   Start     Dose/Rate Route Frequency Ordered Stop   06/04/20 2200  piperacillin-tazobactam (ZOSYN) IVPB  3.375 g  Status:  Discontinued        3.375 g 12.5 mL/hr over 240 Minutes Intravenous Every 8 hours 06/04/20 1450 06/05/20 0654   06/04/20 2030  metroNIDAZOLE (FLAGYL) IVPB 500 mg        500 mg 100 mL/hr over 60 Minutes Intravenous STAT 06/04/20 2018 06/04/20 2057   06/04/20 1500  piperacillin-tazobactam (ZOSYN) IVPB 3.375 g        3.375 g 100 mL/hr over 30 Minutes Intravenous  Once 06/04/20 1448 06/04/20 1547   06/04/20 1445  cefTRIAXone (ROCEPHIN) 2 g in sodium chloride 0.9 % 100 mL IVPB  Status:  Discontinued        2 g 200 mL/hr over 30 Minutes Intravenous Every 24 hours 06/04/20 1432 06/04/20 1447       Assessment/Plan s/p open splenectomy, distal pancreatectomy, wedge gastrectomy and G-J tube placement on 12/11 for bleeding pancreatic mass that was invading the posterior wall of the stomach and splenic hilum.  Bleeding pancreatic mass s/p sx as above -UGI 12/14 negative for leak - Clamp G tube and vent if patient having nausea/vomiting - PPI BID - surgical path pending  -tolerating TF at goal - transition to PM feeds today  - Plan for nocturnal tube feeds at discharge - Full liquid diet for 2 weeks at discharge - Ensure max - goal 3 per day - post-splenectomy vaccines on day of discharge  Acute blood loss anemia  - 2 units pRBC at outside ED, 3 units prbc and 2 units FFP in OR, 1u PRBC 12/13 - Hgb stable this AM  Acutehypoxicrespiratory failure (postoperative) - Extubatedand doing well, still feeling like he needs some supplemental oxygen with increased activity Leukocytosis - post op and post splenectomy, improving   FEN: FLD, ensure max, TF ID: rocephin/flagyl 12/11; Zosyn 12/11>12/12 VTE: SCDs, start lovenox today  Foley:removed Dispo:transition to nocturnal tube feeds, start working on setting up dispo needs for home. May need home oxygen as well. Path still pending   LOS: 5 days    Juliet Rude , [facility] Surgery 06/09/2020, 8:19  AM Please see Amion for pager number during day hours 7:00am-4:30pm

## 2020-06-09 NOTE — Progress Notes (Signed)
PHARMACIST - PHYSICIAN COMMUNICATION   CONCERNING: IV to Oral Route Change Policy  RECOMMENDATION: This patient is receiving protonix by the intravenous route.  Based on criteria approved by the Pharmacy and Therapeutics Committee, the intravenous medication(s) is/are being converted to the equivalent oral dose form(s).   DESCRIPTION: These criteria include:  The patient is eating (either orally or via tube) and/or has been taking other orally administered medications for a least 24 hours  The patient has no evidence of active gastrointestinal bleeding or impaired GI absorption (gastrectomy, short bowel, patient on TNA or NPO).  If you have questions about this conversion, please contact the Pharmacy Department  []   [phone] )  ( [phone] []   [phone] )  [facility] [x]   [phone] )  [facility] []   [phone] )  [facility] []   [phone] )    ( [phone], PharmD Clinical Pharmacist **Pharmacist phone directory can now be found on [email or web address] (PW [email or web address]).  Listed under [facility].   e

## 2020-06-09 NOTE — Progress Notes (Signed)
Nutrition Follow Up  DOCUMENTATION CODES:   Severe malnutrition in context of chronic illness,Underweight  INTERVENTION:    Transition to nocturnal feedings:  -Osmolite 1.5 @ 85 ml/hr x 14 hrs (1700-0700) via J-tube -ProSource TF 45 ml TID  Provides: 1905 kcals, 108 grams protein, 907 ml free water.   Continue Ensure Enlive po TID, each supplement provides 150 kcal and 30 grams of protein  NUTRITION DIAGNOSIS:   Severe Malnutrition related to chronic illness as evidenced by severe fat depletion,severe muscle depletion.  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 No prior medical history per chart review. Pt does indicate COVID-19 illness in Feb 2020   12/11 Open splenectomy, distal pancreatectomy, wedge gastrectomy and G-J placement 12/12 Extubated 12/14- UGI negative for leak  Patient tolerating tube feeding at goal. Transition to nocturnal tonight. Consuming majority of full liquid trays. Was excited to eat grits! Taking in Ensure 2 times daily. RD encouraged supplement and meal intake.   Patient will need follow up with outpatient dietitian. Will forward note to [facility].   Admission weight: 54.9 kg  Current weight: 61.3 kg (taken yesterday)  UOP: 2075 ml x 24 hrs JP drain: 50 ml x 24 hrs  G-tube: 80 ml x 24 hrs   Medications: reviewed  Labs: Na 131 (L) CBG 103-157  Diet Order:   Diet Order            Diet full liquid Room service appropriate? Yes; Fluid consistency: Thin  Diet effective now                 EDUCATION NEEDS:   Education needs have been  addressed  Skin:  Skin Assessment: Skin Integrity Issues: Skin Integrity Issues:: Incisions Incisions: abdomen  Last BM:  12/15  Height:   Ht Readings from Last 1 Encounters:  06/06/20 5\' 10"  (1.778 m)    Weight:   Wt Readings from Last 1 Encounters:  06/08/20 61.3 kg   BMI:  Body mass index is 19.39 kg/m.  Estimated Nutritional Needs:   Kcal:  1925-2190 kcals  Protein:  95-110 g  Fluid:  >/= 1.9 L  06/10/20 RD, LDN Clinical Nutrition Pager listed in AMION

## 2020-06-10 ENCOUNTER — Inpatient Hospital Stay (HOSPITAL_COMMUNITY): Payer: Self-pay

## 2020-06-10 LAB — CBC
HCT: 24.9 % — ABNORMAL LOW (ref 39.0–52.0)
Hemoglobin: 8.4 g/dL — ABNORMAL LOW (ref 13.0–17.0)
MCH: 30.9 pg (ref 26.0–34.0)
MCHC: 33.7 g/dL (ref 30.0–36.0)
MCV: 91.5 fL (ref 80.0–100.0)
Platelets: 587 10*3/uL — ABNORMAL HIGH (ref 150–400)
RBC: 2.72 MIL/uL — ABNORMAL LOW (ref 4.22–5.81)
RDW: 16.1 % — ABNORMAL HIGH (ref 11.5–15.5)
WBC: 12.6 10*3/uL — ABNORMAL HIGH (ref 4.0–10.5)
nRBC: 1.4 % — ABNORMAL HIGH (ref 0.0–0.2)

## 2020-06-10 LAB — GLUCOSE, CAPILLARY
Glucose-Capillary: 104 mg/dL — ABNORMAL HIGH (ref 70–99)
Glucose-Capillary: 105 mg/dL — ABNORMAL HIGH (ref 70–99)
Glucose-Capillary: 107 mg/dL — ABNORMAL HIGH (ref 70–99)
Glucose-Capillary: 161 mg/dL — ABNORMAL HIGH (ref 70–99)
Glucose-Capillary: 163 mg/dL — ABNORMAL HIGH (ref 70–99)
Glucose-Capillary: 91 mg/dL (ref 70–99)

## 2020-06-10 LAB — BASIC METABOLIC PANEL
Anion gap: 11 (ref 5–15)
BUN: 12 mg/dL (ref 6–20)
CO2: 22 mmol/L (ref 22–32)
Calcium: 8.4 mg/dL — ABNORMAL LOW (ref 8.9–10.3)
Chloride: 101 mmol/L (ref 98–111)
Creatinine, Ser: 0.46 mg/dL — ABNORMAL LOW (ref 0.61–1.24)
GFR, Estimated: >60 mL/min (ref >60)
Glucose, Bld: 145 mg/dL — ABNORMAL HIGH (ref 70–99)
Potassium: 4.4 mmol/L (ref 3.5–5.1)
Sodium: 134 mmol/L — ABNORMAL LOW (ref 135–145)

## 2020-06-10 MED ORDER — METHOCARBAMOL 500 MG PO TABS
750.0000 mg | ORAL_TABLET | Freq: Three times a day (TID) | ORAL | Status: DC
  Administered 2020-06-10 – 2020-06-12 (×5): 750 mg via ORAL
  Filled 2020-06-10 (×5): qty 2

## 2020-06-10 MED ORDER — FUROSEMIDE 10 MG/ML IJ SOLN
40.0000 mg | Freq: Once | INTRAMUSCULAR | Status: AC
  Administered 2020-06-10: 17:00:00 40 mg via INTRAVENOUS
  Filled 2020-06-10: qty 4

## 2020-06-10 NOTE — Progress Notes (Signed)
SATURATION QUALIFICATIONS: (This note is used to comply with regulatory documentation for home oxygen)  Patient Saturations on Room Air at Rest = 94%  Patient Saturations on Room Air while Ambulating = 86%  Patient Saturations on 2 Liters of oxygen while Ambulating = 93%  Please briefly explain why patient needs home oxygen:Pt needing O2 with activity to maintain sats >90%.  Cha Gomillion W,PT Acute Rehabilitation Services Pager:  [phone]  Office:  [phone]

## 2020-06-10 NOTE — Progress Notes (Signed)
Physical Therapy Treatment Patient Details Name: Eddie Morrow MRN: [id number] DOB: 01/14/1971 Today's Date: 06/10/2020    History of Present Illness Eddie Morrow is a 49 year old male s/p open splenectomy, distal pancreatectomy, wedge gastrectomy and G-J tube placement on 12/11 for bleeding pancreatic mass that was invading the posterior wall of the stomach and splenic hilum.    PT Comments    Pt admitted with above diagnosis. Pt was able to ambulate and incr distance with good stability. Still needs 2LO2 with mobility.  Will continue to follow.  Pt currently with functional limitations due to balance and endurance deficits. Pt will benefit from skilled PT to increase their independence and safety with mobility to allow discharge to the venue listed below.     Follow Up Recommendations  Home health PT;Supervision/Assistance - 24 hour     Equipment Recommendations  Rolling walker with 5" wheels;3in1 (PT);Other (comment) (home O2)    Recommendations for Other Services       Precautions / Restrictions Precautions Precautions: Fall Precaution Comments: G-tube, JP drain, NG tube Restrictions Weight Bearing Restrictions: No    Mobility  Bed Mobility Overal bed mobility: Needs Assistance Bed Mobility: Rolling;Sidelying to Sit Rolling: Supervision Sidelying to sit: Supervision          Transfers Overall transfer level: Needs assistance Equipment used: Rolling walker (2 wheeled) Transfers: Sit to/from Stand Sit to Stand: Supervision         General transfer comment: Pt was able to stand with min guard steadying assist to RW  Ambulation/Gait Ambulation/Gait assistance: Supervision Gait Distance (Feet): 650 Feet Assistive device: Rolling walker (2 wheeled) Gait Pattern/deviations: Step-through pattern;Decreased stride length;Trunk flexed   Gait velocity interpretation: <1.31 ft/sec, indicative of household ambulator General Gait Details: Pt maintains slightly flexed  posture due to sore abdomen but was more successful in achieving fully upright position.  Gait steady with RW.   Stairs             Wheelchair Mobility    Modified Rankin (Stroke Patients Only)       Balance Overall balance assessment: Needs assistance Sitting-balance support: No upper extremity supported;Feet supported Sitting balance-Leahy Scale: Fair     Standing balance support: Bilateral upper extremity supported;During functional activity Standing balance-Leahy Scale: Poor Standing balance comment: relies on UE support for balance on RW                            Cognition Arousal/Alertness: Awake/alert Behavior During Therapy: WFL for tasks assessed/performed Overall Cognitive Status: Within Functional Limits for tasks assessed                                        Exercises      General Comments General comments (skin integrity, edema, etc.): VSS with pt ambulating on 2LO2 to keep sats >90%.  Pt was RA at rest.  Desat to 86% on RA.      Pertinent Vitals/Pain Pain Assessment: Faces Faces Pain Scale: Hurts little more Pain Location: incision Pain Descriptors / Indicators: Cramping Pain Intervention(s): Limited activity within patient's tolerance;Monitored during session;Repositioned    Home Living                      Prior Function            PT Goals (current goals can now be  found in the care plan section) Acute Rehab PT Goals Patient Stated Goal: to gohome Progress towards PT goals: Progressing toward goals    Frequency    Min 3X/week      PT Plan Current plan remains appropriate    Co-evaluation              AM-PAC PT "6 Clicks" Mobility   Outcome Measure  Help needed turning from your back to your side while in a flat bed without using bedrails?: A Little Help needed moving from lying on your back to sitting on the side of a flat bed without using bedrails?: A Little Help needed moving  to and from a bed to a chair (including a wheelchair)?: A Little Help needed standing up from a chair using your arms (e.g., wheelchair or bedside chair)?: A Little Help needed to walk in hospital room?: A Little Help needed climbing 3-5 steps with a railing? : A Little 6 Click Score: 18    End of Session Equipment Utilized During Treatment: Gait belt;Oxygen Activity Tolerance: Patient limited by fatigue Patient left: with call bell/phone within reach;in bed Nurse Communication: Mobility status PT Visit Diagnosis: Muscle weakness (generalized) (M62.81);Pain Pain - part of body:  (abdomen)     Time: 9357-0177 PT Time Calculation (min) (ACUTE ONLY): 17 min  Charges:  $Gait Training: 8-22 mins                     Eddie Morrow,PT Acute Rehabilitation Services Pager:  [phone]  Office:  [phone]     Berline Lopes 06/10/2020, 2:26 PM

## 2020-06-10 NOTE — TOC Progression Note (Signed)
Transition of Care (TOC) - Progression Note  Donn Pierini RN, BSN Transitions of Care Unit 4E- RN Case Manager See Treatment Team for direct phone #    Patient Details  Name: Eddie Morrow MRN: [id number] Date of Birth: 1970-07-26  Transition of Care [facility]) CM/SW Contact  Zenda Alpers Lenn Sink, RN Phone Number: 06/10/2020, 4:21 PM  Clinical Narrative:    Spoke with pt at bedside, [employer] is working on home TF- and will contact pt on discharge. Pt also states he has spoken with someone regarding disability applications and such but wants to be sure- call placed and msg left for [facility] to follow up with pt regarding questions.   Per PT note- pt will also need home 02- MD please place orders- pt will need [employer] to assess for home charity 02- call made to [facility] with [employer] for this and he will f/u once DME orders placed for home 02. Pt also requesting RW- DME order needed as well for this.   As per PT notes- recs for HHPT- call made to [facility]- Futures trader for [facility] this week)- for pre-referral pending orders- after reviewRolene Arbour returned call to notify that they care unable to service pt's address area.   TOC to f/u prior to discharge regarding home 02 needs/RW   Expected Discharge Plan: Home w Home Health Services Barriers to Discharge: Continued Medical Work up  Expected Discharge Plan and Services Expected Discharge Plan: Home w Home Health Services     Post Acute Care Choice: Durable Medical Equipment,Home Health Living arrangements for the past 2 months: Single Family Home                 DME Arranged: Tube feeding DME Agency: [employer] Date DME Agency Contacted: 06/09/20 Time DME Agency Contacted: 1530 Representative spoke with at DME Agency: Ian Malkin             Social Determinants of Health (SDOH) Interventions    Readmission Risk Interventions No flowsheet data found.

## 2020-06-10 NOTE — Progress Notes (Signed)
Progress Note  6 Days Post-Op  Subjective: Patient reports he had a rough night, woke up in the bottom of the bed and a little confused. Oriented now and feels a little better. Some intermittent pains in right back. Feels like tylenol and robaxin helped yesterday. Tolerating FLD and having bowel function.   Per TOC note, it appears patient will be able to get home health and tube feedings.   Objective: Vital signs in last 24 hours: Temp:  [97.8 F (36.6 C)-98.7 F (37.1 C)] 98.2 F (36.8 C) (12/17 0807) Pulse Rate:  [74-85] 79 (12/17 0807) Resp:  [14-18] 18 (12/17 0807) BP: (89-116)/(54-68) 116/65 (12/17 0807) SpO2:  [91 %-98 %] 94 % (12/17 0807) Weight:  [51.9 kg] 51.9 kg (12/17 0345) Last BM Date: 06/09/20  Intake/Output from previous day: 12/16 0701 - 12/17 0700 In: 1605 [P.O.:480; NG/GT:930] Out: 2305 [Urine:1875; Drains:430] Intake/Output this shift: No intake/output data recorded.  PE: General: pleasant, WD, cachectic male who is laying in bed in NAD Heart: regular, rate, and rhythm.  Lungs: CTAB, no wheezes, rhonchi, or rales noted. Respiratory effort nonlabored Abd: soft, appropriately ttp, ND, +BS, midline incision c/d/i with staples present, drain with SS fluid, g-tubein place   Lab Results:  Recent Labs    06/09/20 0103 06/10/20 0059  WBC 13.6* 12.6*  HGB 8.0* 8.4*  HCT 23.5* 24.9*  PLT 473* 587*   BMET Recent Labs    06/09/20 0103 06/10/20 0059  NA 131* 134*  K 3.9 4.4  CL 98 101  CO2 23 22  GLUCOSE 132* 145*  BUN 8 12  CREATININE 0.39* 0.46*  CALCIUM 8.1* 8.4*   PT/INR No results for input(s): LABPROT, INR in the last 72 hours. CMP     Component Value Date/Time   NA 134 (L) 06/10/2020 0059   K 4.4 06/10/2020 0059   CL 101 06/10/2020 0059   CO2 22 06/10/2020 0059   GLUCOSE 145 (H) 06/10/2020 0059   BUN 12 06/10/2020 0059   CREATININE 0.46 (L) 06/10/2020 0059   CALCIUM 8.4 (L) 06/10/2020 0059   PROT 4.0 (L) 06/05/2020 0050    ALBUMIN 2.2 (L) 06/05/2020 0050   AST 72 (H) 06/05/2020 0050   ALT 34 06/05/2020 0050   ALKPHOS 42 06/05/2020 0050   BILITOT 1.7 (H) 06/05/2020 0050   GFRNONAA >60 06/10/2020 0059   Lipase  No results found for: LIPASE     Studies/Results: No results found.  Anti-infectives: Anti-infectives (From admission, onward)   Start     Dose/Rate Route Frequency Ordered Stop   06/04/20 2200  piperacillin-tazobactam (ZOSYN) IVPB 3.375 g  Status:  Discontinued        3.375 g 12.5 mL/hr over 240 Minutes Intravenous Every 8 hours 06/04/20 1450 06/05/20 0654   06/04/20 2030  metroNIDAZOLE (FLAGYL) IVPB 500 mg        500 mg 100 mL/hr over 60 Minutes Intravenous STAT 06/04/20 2018 06/04/20 2057   06/04/20 1500  piperacillin-tazobactam (ZOSYN) IVPB 3.375 g        3.375 g 100 mL/hr over 30 Minutes Intravenous  Once 06/04/20 1448 06/04/20 1547   06/04/20 1445  cefTRIAXone (ROCEPHIN) 2 g in sodium chloride 0.9 % 100 mL IVPB  Status:  Discontinued        2 g 200 mL/hr over 30 Minutes Intravenous Every 24 hours 06/04/20 1432 06/04/20 1447       Assessment/Plan s/p open splenectomy, distal pancreatectomy, wedge gastrectomy and G-J tube placement on 12/11 for  bleeding pancreatic mass that was invading the posterior wall of the stomach and splenic hilum.  Bleeding pancreatic mass s/p sx as above -UGI12/14 negative for leak - Clamp G tube and vent if patient having nausea/vomiting - PPI BID - surgical path negative for malignancy, pt aware - Plan for nocturnal tube feeds at discharge - Full liquid diet for 2 weeks at discharge - Ensure max - goal 3 per day - post-splenectomy vaccines on day of discharge  Acute blood loss anemia - 2 units pRBC at outside ED, 3 units prbc and 2 units FFP in OR, 1u PRBC 12/13 - Hgbstable this AM  Acutehypoxic respiratory failure (postoperative)/Emphysema- Extubatedand doing well, still feeling like he needs some supplemental oxygen with increased  activity - CXR today, continue duoneb and mucinex, IS and flutter valve Leukocytosis- post op and post splenectomy, improving   FEN: FLD, ensure max, nocturnal TF ID: rocephin/flagyl 12/11; Zosyn 12/11>12/12 VTE: SCDs, lovenox Foley:removed Dispo: wean O2 as able, continue therapies. Possible discharge home over the weekend if we can figure out home health  LOS: 6 days    Juliet Rude , [facility] 06/10/2020, 9:19 AM Please see Amion for pager number during day hours 7:00am-4:30pm

## 2020-06-10 NOTE — Progress Notes (Signed)
   06/10/20 1200   PT - Assessment/Plan  Follow Up Recommendations Home health PT;Supervision/Assistance - 24 hour  PT equipment Rolling walker with 5" wheels;3in1 (PT);Other (comment) (home O2)  Full note to follow.  Pt will need above equipment. Thanks. Ziaire Hagos W,PT Acute Rehabilitation Services Pager:  [phone]  Office:  [phone]

## 2020-06-11 LAB — CBC
HCT: 29.4 % — ABNORMAL LOW (ref 39.0–52.0)
Hemoglobin: 9.3 g/dL — ABNORMAL LOW (ref 13.0–17.0)
MCH: 29.8 pg (ref 26.0–34.0)
MCHC: 31.6 g/dL (ref 30.0–36.0)
MCV: 94.2 fL (ref 80.0–100.0)
Platelets: 865 10*3/uL — ABNORMAL HIGH (ref 150–400)
RBC: 3.12 MIL/uL — ABNORMAL LOW (ref 4.22–5.81)
RDW: 16.2 % — ABNORMAL HIGH (ref 11.5–15.5)
WBC: 17.7 10*3/uL — ABNORMAL HIGH (ref 4.0–10.5)
nRBC: 0.6 % — ABNORMAL HIGH (ref 0.0–0.2)

## 2020-06-11 LAB — BASIC METABOLIC PANEL
Anion gap: 11 (ref 5–15)
BUN: 15 mg/dL (ref 6–20)
CO2: 26 mmol/L (ref 22–32)
Calcium: 9 mg/dL (ref 8.9–10.3)
Chloride: 96 mmol/L — ABNORMAL LOW (ref 98–111)
Creatinine, Ser: 0.58 mg/dL — ABNORMAL LOW (ref 0.61–1.24)
GFR, Estimated: >60 mL/min (ref >60)
Glucose, Bld: 112 mg/dL — ABNORMAL HIGH (ref 70–99)
Potassium: 4.6 mmol/L (ref 3.5–5.1)
Sodium: 133 mmol/L — ABNORMAL LOW (ref 135–145)

## 2020-06-11 LAB — GLUCOSE, CAPILLARY
Glucose-Capillary: 107 mg/dL — ABNORMAL HIGH (ref 70–99)
Glucose-Capillary: 117 mg/dL — ABNORMAL HIGH (ref 70–99)
Glucose-Capillary: 117 mg/dL — ABNORMAL HIGH (ref 70–99)
Glucose-Capillary: 136 mg/dL — ABNORMAL HIGH (ref 70–99)
Glucose-Capillary: 93 mg/dL (ref 70–99)

## 2020-06-11 MED ORDER — IPRATROPIUM-ALBUTEROL 0.5-2.5 (3) MG/3ML IN SOLN: 3.0000 mL | Freq: Four times a day (QID) | RESPIRATORY_TRACT | Status: DC | PRN

## 2020-06-11 MED ORDER — SODIUM BICARBONATE 650 MG PO TABS
650.0000 mg | ORAL_TABLET | Freq: Once | ORAL | Status: AC
  Administered 2020-06-11: 16:00:00 650 mg
  Filled 2020-06-11: qty 1

## 2020-06-11 MED ORDER — PANCRELIPASE (LIP-PROT-AMYL) 10440-39150 UNITS PO TABS
20880.0000 [IU] | ORAL_TABLET | Freq: Once | ORAL | Status: AC
  Administered 2020-06-11: 16:00:00 20880 [IU]
  Filled 2020-06-11: qty 2

## 2020-06-11 NOTE — Progress Notes (Signed)
7 Days Post-Op   Subjective/Chief Complaint: No complaints this morning Tolerating tube feeds and full liquids Had BM   Objective: Vital signs in last 24 hours: Temp:  [97.8 F (36.6 C)-98.7 F (37.1 C)] 98.2 F (36.8 C) (12/18 0758) Pulse Rate:  [67-90] 86 (12/18 0758) Resp:  [13-20] 14 (12/18 0758) BP: (92-107)/(59-72) 94/69 (12/18 0758) SpO2:  [92 %-95 %] 92 % (12/18 0758) Last BM Date: 06/09/20  Intake/Output from previous day: 12/17 0701 - 12/18 0700 In: -  Out: 2750 [Urine:2600; Drains:150] Intake/Output this shift: No intake/output data recorded.  Exam: Looks comfortable this morning Awake and alert Abdomen soft, drains stable  Lab Results:  Recent Labs    06/10/20 0059 06/11/20 0102  WBC 12.6* 17.7*  HGB 8.4* 9.3*  HCT 24.9* 29.4*  PLT 587* 865*   BMET Recent Labs    06/10/20 0059 06/11/20 0102  NA 134* 133*  K 4.4 4.6  CL 101 96*  CO2 22 26  GLUCOSE 145* 112*  BUN 12 15  CREATININE 0.46* 0.58*  CALCIUM 8.4* 9.0   PT/INR No results for input(s): LABPROT, INR in the last 72 hours. ABG No results for input(s): PHART, HCO3 in the last 72 hours.  Invalid input(s): PCO2, PO2  Studies/Results: DG CHEST PORT 1 VIEW  Result Date: 06/10/2020 CLINICAL DATA:  Hypoxia. EXAM: PORTABLE CHEST 1 VIEW COMPARISON:  06/04/2020 FINDINGS: The cardiomediastinal silhouette is unchanged with normal heart size. Severe bullous emphysema is again noted including a large left upper lobe bulla. There is persistent mild parenchymal opacity in the left lung base with evidence of a new small left pleural effusion. No airspace consolidation is seen in the right lung, and no definite pneumothorax is identified. Midline skin staples are partially visualized in the upper abdomen. IMPRESSION: 1. New small left pleural effusion with left basilar atelectasis. 2. Severe bullous emphysema. Electronically Signed   By: Sebastian Ache M.D.   On: 06/10/2020 09:40   US Abdomen Limited  RUQ (LIVER/GB)  Result Date: 06/10/2020 CLINICAL DATA:  Concern for gallstone history of distal pancreatectomy wedge gastrectomy and splenectomy EXAM: ULTRASOUND ABDOMEN LIMITED RIGHT UPPER QUADRANT COMPARISON:  CT 06/04/2020 FINDINGS: Gallbladder: No gallstones or wall thickening visualized. No sonographic Murphy sign noted by sonographer. Common bile duct: Diameter: 1.5 mm Liver: Liver appears slightly echogenic. No focal hepatic abnormality. Portal vein is patent on color Doppler imaging with normal direction of blood flow towards the liver. Other: Small amount of free fluid adjacent to the liver. IMPRESSION: 1. Negative for gallstones. 2. Slightly echogenic liver as may be seen with steatosis. Electronically Signed   By: Jasmine Pang M.D.   On: 06/10/2020 20:14    Anti-infectives: Anti-infectives (From admission, onward)   Start     Dose/Rate Route Frequency Ordered Stop   06/04/20 2200  piperacillin-tazobactam (ZOSYN) IVPB 3.375 g  Status:  Discontinued        3.375 g 12.5 mL/hr over 240 Minutes Intravenous Every 8 hours 06/04/20 1450 06/05/20 0654   06/04/20 2030  metroNIDAZOLE (FLAGYL) IVPB 500 mg        500 mg 100 mL/hr over 60 Minutes Intravenous STAT 06/04/20 2018 06/04/20 2057   06/04/20 1500  piperacillin-tazobactam (ZOSYN) IVPB 3.375 g        3.375 g 100 mL/hr over 30 Minutes Intravenous  Once 06/04/20 1448 06/04/20 1547   06/04/20 1445  cefTRIAXone (ROCEPHIN) 2 g in sodium chloride 0.9 % 100 mL IVPB  Status:  Discontinued  2 g 200 mL/hr over 30 Minutes Intravenous Every 24 hours 06/04/20 1432 06/04/20 1447      Assessment/Plan: s/p open splenectomy, distal pancreatectomy, wedge gastrectomy and G-J tube placement on 12/11 for bleeding pancreatic mass that was invading the posterior wall of the stomach and splenic hilum.  Bleeding pancreatic mass s/p sx as above  - surgical path negative for malignancy, pt aware - Plan for nocturnal tube feeds at discharge - Full  liquid diet for 2 weeks at discharge - Ensure max - goal 3 per day - post-splenectomy vaccines on day of discharge  Acute blood loss anemia Hgb stable  Acutehypoxic respiratory failure (postoperative)/Emphysema- set up for home O2 Leukocytosis- post op and post splenectomy  FEN:FLD, ensure max, nocturnal TF ID: rocephin/flagyl 12/11; Zosyn 12/11>12/12 VTE: SCDs, lovenox Foley:removed Dispo: discharge today if arrangements about tube feeds can be made  LOS: 7 days    Abigail Miyamoto MD [facility]  Use Amion to contract on call provider for questions 06/11/2020

## 2020-06-11 NOTE — Progress Notes (Signed)
Unable to unclog j tube side of enteric feeding tube. Recommended switching to g tube portion for tube feeds as he has minimal output from the gravity drainage

## 2020-06-11 NOTE — Discharge Instructions (Signed)
[facility]      [facility] °[phone] ° °OPEN ABDOMINAL SURGERY: POST OP INSTRUCTIONS ° °Always review your discharge instruction sheet given to you by the facility where your surgery was performed. ° °IF YOU HAVE DISABILITY OR FAMILY LEAVE FORMS, YOU MUST BRING THEM TO THE OFFICE FOR PROCESSING.  PLEASE DO NOT GIVE THEM TO YOUR DOCTOR. ° °1. A prescription for pain medication may be given to you upon discharge.  Take your pain medication as prescribed, if needed.  If narcotic pain medicine is not needed, then you may take acetaminophen (Tylenol) or ibuprofen (Advil) as needed. °2. Take your usually prescribed medications unless otherwise directed. °3. If you need a refill on your pain medication, please contact your pharmacy. They will contact our office to request authorization.  Prescriptions will not be filled after 5pm or on week-ends. °4. You should follow a light diet the first few days after arrival home, such as soup and crackers, pudding, etc.unless your doctor has advised otherwise. A high-fiber, low fat diet can be resumed as tolerated.   Be sure to include lots of fluids daily. Most patients will experience some swelling and bruising on the chest and neck area.  Ice packs will help.  Swelling and bruising can take several days to resolve °5. Most patients will experience some swelling and bruising in the area of the incision. Ice pack will help. Swelling and bruising can take several days to resolve..  °6. It is common to experience some constipation if taking pain medication after surgery.  Increasing fluid intake and taking a stool softener will usually help or prevent this problem from occurring.  A mild laxative (Milk of Magnesia or Miralax) should be taken according to package directions if there are no bowel movements after 48 hours. °7.  You may have steri-strips (small skin tapes) in place directly over the incision.  These strips should be left on the skin for 7-10 days.  If your  surgeon used skin glue on the incision, you may shower in 24 hours.  The glue will flake off over the next 2-3 weeks.  Any sutures or staples will be removed at the office during your follow-up visit. You may find that a light gauze bandage over your incision may keep your staples from being rubbed or pulled. You may shower and replace the bandage daily. °8. ACTIVITIES:  You may resume regular (light) daily activities beginning the next day--such as daily self-care, walking, climbing stairs--gradually increasing activities as tolerated.  You may have sexual intercourse when it is comfortable.  Refrain from any heavy lifting or straining until approved by your doctor. °a. You may drive when you no longer are taking prescription pain medication, you can comfortably wear a seatbelt, and you can safely maneuver your car and apply brakes °b. Return to Work: ___________________________________ °9. You should see your doctor in the office for a follow-up appointment approximately two weeks after your surgery.  Make sure that you call for this appointment within a day or two after you arrive home to insure a convenient appointment time. °OTHER INSTRUCTIONS:  °_____________________________________________________________ °_____________________________________________________________ ° °WHEN TO CALL YOUR DOCTOR: °1. Fever over 101.0 °2. Inability to urinate °3. Nausea and/or vomiting °4. Extreme swelling or bruising °5. Continued bleeding from incision. °6. Increased pain, redness, or drainage from the incision. °7. Difficulty swallowing or breathing °8. Muscle cramping or spasms. °9. Numbness or tingling in hands or feet or around lips. ° °The clinic staff is available to   answer your questions during regular business hours.  Please dont hesitate to call and ask to speak to one of the nurses if you have concerns.  For further questions, please visit [email or web address]     Full Liquid Diet A full liquid  diet refers to fluids and foods that are liquid or will become liquid at room temperature. This diet should only be used for a short period of time to help you recover from illness or surgery. Your health care provider or dietitian will help you determine when it is safe to eat regular foods. What are tips for following this plan?     Reading food labels  Check food labels of nutrition shakes for the amount of protein. Look for nutrition shakes that have at least 8-10 grams of protein in each serving.  Look for drinks, such as milks and juices, that are "fortified" or "enriched." This means that vitamins and minerals have been added. Shopping  Buy premade nutritional shakes to keep on hand.  To vary your choices, buy different flavors of milks and shakes. Meal planning  Choose flavors and foods that you enjoy.  To make sure you get enough energy from food (calories): ? Eat 3 full liquid meals each day. Have a liquid snack between each meal. ? Drink 6-8 ounces (177-237 ml) of a nutrition supplement shake with meals or as snacks. ? Add protein powder, powdered milk, milk, or yogurt to shakes to increase the amount of protein.  Drink at least one serving a day of citrus fruit juice or fruit juice that has vitamin C added. General guidelines  Before starting the full-liquid diet, check with your health care provider to know what foods you should avoid. These may include full-fat or high-fiber liquids.  You may have any liquid or food that becomes a liquid at room temperature. The food is considered a liquid if it can be poured off a spoon at room temperature.  Do not drink alcohol unless approved by your health care provider.  This diet gives you most of the nutrients that you need for energy, but you may not get enough of certain vitamins, minerals, and fiber. Make sure to talk to your health care provider or dietitian about: ? How many calories you need to eat get day. ? How much  fluid you should have each day. ? Taking a multivitamin or a nutritional supplement. What foods are allowed? The items listed may not be a complete list. Talk with your dietitian about what dietary choices are best for you. Grains Thin hot cereal, such as cream of wheat. Soft-cooked pasta or rice pured in soup. Vegetables Pulp-free tomato or vegetable juice. Vegetables pured in soup. Fruits Fruit juice without pulp. Strained fruit pures (seeds and skins removed). Meats and other protein foods Beef, chicken, and fish broths. Powdered protein supplements. Dairy Milk and milk-based beverages, including milk shakes and instant breakfast mixes. Smooth yogurt. Pured cottage cheese. Beverages Water. Coffee and tea (caffeinated or decaffeinated). Cocoa. Liquid nutritional supplements. Soft drinks. Nondairy milks, such as almond, coconut, rice, or soy milk. Fats and oils Melted margarine and butter. Cream. Canola, almond, avocado, corn, grapeseed, sunflower, and sesame oils. Gravy. Sweets and desserts Custard. Pudding. Flavored gelatin. Smooth ice cream (without nuts or candy pieces). Sherbet. Popsicles. [place] ice. Pudding pops. Seasoning and other foods Salt and pepper. Spices. Cocoa powder. Vinegar. Ketchup. Yellow mustard. Smooth sauces, such as [place], cheese sauce, or white sauce. Soy sauce. Cream soups. Strained soups. Syrup. Honey. Jelly (  without fruit pieces). What foods are not allowed? The items listed may not be a complete list. Talk with your dietitian about what dietary choices are best for you. Grains Whole grains. Pasta. Rice. Cold cereal. Bread. Crackers. Vegetables All whole fresh, frozen, or canned vegetables. Fruits All whole fresh, frozen, or canned fruits. Meats and other protein foods All cuts of meat, poultry, and fish. Eggs. Tofu and soy protein. Nuts and nut butters. Lunch meat. Sausage. Dairy Hard cheese. Yogurt with fruit chunks. Fats and oils Coconut  oil. Palm oil. Lard. Cold butter. Sweets and desserts Ice cream or other frozen desserts that have any solids in them or on top, such as nuts, chocolate chips, and pieces of cookies. Cakes. Cookies. Candy. Seasoning and other foods Stone-ground mustards. Soups with chunks or pieces. Summary  A full liquid diet refers to fluids and foods that are liquid or will become liquid at room temperature.  This diet should only be used for a short period of time to help you recover from illness or surgery. Ask your health care provider or dietitian when it is safe for you to eat regular foods.  To make sure you get enough calories and nutrients, eat 3 meals each day with snacks between. Drink premade nutrition supplement shakes or add protein powder to homemade shakes. Take a vitamin and mineral supplement as told by your health care provider. This information is not intended to replace advice given to you by your health care provider. Make sure you discuss any questions you have with your health care provider. Document Revised: 09/07/2017 Document Reviewed: 07/25/2016 Elsevier Patient Education  2020 [employer].

## 2020-06-12 LAB — BASIC METABOLIC PANEL
Anion gap: 10 (ref 5–15)
BUN: 14 mg/dL (ref 6–20)
CO2: 24 mmol/L (ref 22–32)
Calcium: 8.7 mg/dL — ABNORMAL LOW (ref 8.9–10.3)
Chloride: 99 mmol/L (ref 98–111)
Creatinine, Ser: 0.57 mg/dL — ABNORMAL LOW (ref 0.61–1.24)
GFR, Estimated: >60 mL/min (ref >60)
Glucose, Bld: 134 mg/dL — ABNORMAL HIGH (ref 70–99)
Potassium: 4.4 mmol/L (ref 3.5–5.1)
Sodium: 133 mmol/L — ABNORMAL LOW (ref 135–145)

## 2020-06-12 LAB — CBC
HCT: 28 % — ABNORMAL LOW (ref 39.0–52.0)
Hemoglobin: 8.9 g/dL — ABNORMAL LOW (ref 13.0–17.0)
MCH: 29.8 pg (ref 26.0–34.0)
MCHC: 31.8 g/dL (ref 30.0–36.0)
MCV: 93.6 fL (ref 80.0–100.0)
Platelets: 941 10*3/uL (ref 150–400)
RBC: 2.99 MIL/uL — ABNORMAL LOW (ref 4.22–5.81)
RDW: 15.9 % — ABNORMAL HIGH (ref 11.5–15.5)
WBC: 13.7 10*3/uL — ABNORMAL HIGH (ref 4.0–10.5)
nRBC: 0.4 % — ABNORMAL HIGH (ref 0.0–0.2)

## 2020-06-12 LAB — GLUCOSE, CAPILLARY
Glucose-Capillary: 207 mg/dL — ABNORMAL HIGH (ref 70–99)
Glucose-Capillary: 124 mg/dL — ABNORMAL HIGH (ref 70–99)
Glucose-Capillary: 94 mg/dL (ref 70–99)

## 2020-06-12 MED ORDER — OSMOLITE 1.5 CAL PO LIQD: ORAL | 5 refills | Status: DC

## 2020-06-12 MED ORDER — ENSURE MAX PROTEIN PO LIQD: 11.0000 [oz_av] | Freq: Three times a day (TID) | ORAL | Status: AC

## 2020-06-12 MED ORDER — PANTOPRAZOLE SODIUM 40 MG PO TBEC: 40.0000 mg | DELAYED_RELEASE_TABLET | Freq: Two times a day (BID) | ORAL | 0 refills | Status: DC

## 2020-06-12 MED ORDER — PROSOURCE TF PO LIQD: 45.0000 mL | Freq: Three times a day (TID) | ORAL | 0 refills | Status: AC

## 2020-06-12 MED ORDER — OXYCODONE HCL 5 MG PO TABS
5.0000 mg | ORAL_TABLET | Freq: Four times a day (QID) | ORAL | 0 refills | Status: DC | PRN
Start: 2020-06-12 — End: 2021-10-11

## 2020-06-12 MED ORDER — METHOCARBAMOL 750 MG PO TABS: 750.0000 mg | ORAL_TABLET | Freq: Three times a day (TID) | ORAL | 0 refills | Status: DC | PRN

## 2020-06-12 NOTE — Plan of Care (Signed)
  Problem: Education: Goal: Knowledge of General Education information will improve Description: Including pain rating scale, medication(s)/side effects and non-pharmacologic comfort measures Outcome: Progressing   Problem: Health Behavior/Discharge Planning: Goal: Ability to manage health-related needs will improve Outcome: Progressing   Problem: Clinical Measurements: Goal: Ability to maintain clinical measurements within normal limits will improve Outcome: Progressing Goal: Will remain free from infection Outcome: Progressing Goal: Diagnostic test results will improve Outcome: Progressing Goal: Respiratory complications will improve Outcome: Progressing Goal: Cardiovascular complication will be avoided Outcome: Progressing   Problem: Activity: Goal: Risk for activity intolerance will decrease Outcome: Progressing   Problem: Nutrition: Goal: Adequate nutrition will be maintained Outcome: Progressing   Problem: Elimination: Goal: Will not experience complications related to bowel motility Outcome: Progressing   Problem: Pain Managment: Goal: General experience of comfort will improve Outcome: Progressing   Problem: Safety: Goal: Ability to remain free from injury will improve Outcome: Progressing   Problem: Skin Integrity: Goal: Risk for impaired skin integrity will decrease Outcome: Progressing   Problem: Clinical Measurements: Goal: Ability to maintain clinical measurements within normal limits will improve Outcome: Progressing Goal: Postoperative complications will be avoided or minimized Outcome: Progressing   Problem: Skin Integrity: Goal: Demonstration of wound healing without infection will improve Outcome: Progressing   Problem: Education: Goal: Knowledge of disease or condition will improve Outcome: Progressing Goal: Knowledge of secondary prevention will improve Outcome: Progressing Goal: Knowledge of patient specific risk factors addressed and  post discharge goals established will improve Outcome: Progressing Goal: Individualized Educational Video(s) Outcome: Progressing

## 2020-06-12 NOTE — Progress Notes (Signed)
CRITICAL VALUE ALERT  Critical Value:  Platelet 941  Date & Time Notied:  06/12/20, 0204  Provider Notified:DR. Luke Kinsinger  Orders Received/Actions taken: no new orders at this time. Will continue to monitor.

## 2020-06-12 NOTE — TOC Transition Note (Addendum)
Transition of Care [facility]) - CM/SW Discharge Note   Patient Details  Name: Eddie Morrow MRN: [id number] Date of Birth: 1971-02-03  Transition of Care [facility]) CM/SW Contact:  Lawerance Sabal, RN Phone Number: 06/12/2020, 1:20 PM   Clinical Narrative:   Spoke w patient over the phone. For tube feeds he states that he will be administering them via gravity. He states that he has 24 8 oz cans at the bedside to take home w him. He states these will last 14 days and he has an appointment in 10 days to get tube removed.  He has home oxygen tanks at bedside, that he states were delivered to the room 2 days ago. He states that he has made plans with the O2 company to exchange tanks when empty for full ones as he chooses to not have concentrator at the house. He states that he has not been using it continuously, and has been ambulating in the room without it. He declines RW, and all other DME. He declines need for [facility].  We discussed him establishing PCP with [facility] Department. His family was in the room ready to transport him home.     Final next level of care: Home/Self Care Barriers to Discharge: No Barriers Identified   Patient Goals and CMS Choice Patient states their goals for this hospitalization and ongoing recovery are:: return home CMS Medicare.gov Compare Post Acute Care list provided to:: Patient Choice offered to / list presented to : NA (charity care)  Discharge Placement                       Discharge Plan and Services     Post Acute Care Choice: Durable Medical Equipment,Home Health          DME Arranged: Tube feeding DME Agency: [employer] Date DME Agency Contacted: 06/09/20 Time DME Agency Contacted: 1530 Representative spoke with at DME Agency: Ian Malkin         Representative spoke with at [facility] Agency: no agency accepting, patient also declining  Social Determinants of Health (SDOH) Interventions     Readmission Risk Interventions No flowsheet  data found.

## 2020-06-12 NOTE — Progress Notes (Signed)
8 Days Post-Op   Subjective/Chief Complaint: Doing well Using g-tube port rather than j-tube given clog   Objective: Vital signs in last 24 hours: Temp:  [97.8 F (36.6 C)-98.2 F (36.8 C)] 98.2 F (36.8 C) (12/19 0820) Pulse Rate:  [67-96] 67 (12/19 0820) Resp:  [15-27] 19 (12/19 0820) BP: (97-103)/(56-78) 99/56 (12/19 0820) SpO2:  [95 %-98 %] 97 % (12/19 0820) Weight:  [50.1 kg] 50.1 kg (12/19 0603) Last BM Date: 06/11/20  Intake/Output from previous day: 12/18 0701 - 12/19 0700 In: 1385.3 [P.O.:480; NG/GT:905.3] Out: 805 [Urine:725; Drains:80] Intake/Output this shift: No intake/output data recorded.  Exam: Awake and alert Abdomen soft, incision clean  Lab Results:  Recent Labs    06/11/20 0102 06/12/20 0048  WBC 17.7* 13.7*  HGB 9.3* 8.9*  HCT 29.4* 28.0*  PLT 865* 941*   BMET Recent Labs    06/11/20 0102 06/12/20 0048  NA 133* 133*  K 4.6 4.4  CL 96* 99  CO2 26 24  GLUCOSE 112* 134*  BUN 15 14  CREATININE 0.58* 0.57*  CALCIUM 9.0 8.7*   PT/INR No results for input(s): LABPROT, INR in the last 72 hours. ABG No results for input(s): PHART, HCO3 in the last 72 hours.  Invalid input(s): PCO2, PO2  Studies/Results: US Abdomen Limited RUQ (LIVER/GB)  Result Date: 06/10/2020 CLINICAL DATA:  Concern for gallstone history of distal pancreatectomy wedge gastrectomy and splenectomy EXAM: ULTRASOUND ABDOMEN LIMITED RIGHT UPPER QUADRANT COMPARISON:  CT 06/04/2020 FINDINGS: Gallbladder: No gallstones or wall thickening visualized. No sonographic Murphy sign noted by sonographer. Common bile duct: Diameter: 1.5 mm Liver: Liver appears slightly echogenic. No focal hepatic abnormality. Portal vein is patent on color Doppler imaging with normal direction of blood flow towards the liver. Other: Small amount of free fluid adjacent to the liver. IMPRESSION: 1. Negative for gallstones. 2. Slightly echogenic liver as may be seen with steatosis. Electronically Signed    By: Jasmine Pang M.D.   On: 06/10/2020 20:14    Anti-infectives: Anti-infectives (From admission, onward)   Start     Dose/Rate Route Frequency Ordered Stop   06/04/20 2200  piperacillin-tazobactam (ZOSYN) IVPB 3.375 g  Status:  Discontinued        3.375 g 12.5 mL/hr over 240 Minutes Intravenous Every 8 hours 06/04/20 1450 06/05/20 0654   06/04/20 2030  metroNIDAZOLE (FLAGYL) IVPB 500 mg        500 mg 100 mL/hr over 60 Minutes Intravenous STAT 06/04/20 2018 06/04/20 2057   06/04/20 1500  piperacillin-tazobactam (ZOSYN) IVPB 3.375 g        3.375 g 100 mL/hr over 30 Minutes Intravenous  Once 06/04/20 1448 06/04/20 1547   06/04/20 1445  cefTRIAXone (ROCEPHIN) 2 g in sodium chloride 0.9 % 100 mL IVPB  Status:  Discontinued        2 g 200 mL/hr over 30 Minutes Intravenous Every 24 hours 06/04/20 1432 06/04/20 1447      Assessment/Plan: s/p Procedure(s): SPLENECTOMY, PARTIAL GASTRECTOMY, DISTAL PANCREATECTOMY, GASTRO-JEJUNO TUBE PLACEMENT (N/A)  Doing well Discharge home today   LOS: 8 days    Abigail Miyamoto MD 06/12/2020   Use Amion to page provider on call for questions

## 2020-06-13 LAB — PATHOLOGIST SMEAR REVIEW

## 2020-06-13 NOTE — Discharge Summary (Signed)
[facility] Discharge Summary   Patient ID: Eddie Morrow MRN: [id number] DOB/AGE: 07/19/1970 49 y.o.  Admit date: 06/04/2020 Discharge date: 06/12/2020  Admitting Diagnosis: Abdominal pain Ruptured spleen  Discharge Diagnosis Ruptured spleen, necrotic pancreatic mass >>surgical path negative for malignancy Acute blood loss anemia Acute hypoxic respiratory failure Emphysema  Consultants None  Imaging: No results found.  Procedures Dr. Dossie Der (06/04/2020) - Splenectomy, Partial wedge gastrectomy, Distal pancreatectomy, Gastrojejunal tube placement  Hospital Course:  Eddie Morrow is a 49yo male who was transferred from [facility] to [facility] 12/11 for evaluation of abdominal pain. He was found to have a ruptured spleen as well as acute blood loss anemia requiring 2 units PRBCs at outside ED, 3 units PRBCs and 2 units FFP in OR.  Notably a few weeks ago he did fall from 8 feet and had some bruising on his right side.  He did not have any bruising on his left side at that time. No other trauma or fall since then. Patient was admitted to the surgical service and taken to the operating room where he was found to have a ruptured spleen with necrotic pancreatic mass. He underwent splenectomy, partial wedge gastrectomy, distal pancreatectomy, and gastrojejunal tube placement. Surgical pathology negative for malignancy. He received 1 more unit PRBCs 12/13 for anemia; hemoglobin remained stable after this. Postoperatively once bowel function returned patient was advanced to a full liquid diet which he will continue at home. He was started on jejunal tube feedings for supplemental nutrition. J-tube clogged and he was transitioned to nocturnal tube feedings via gastrostomy tube portion. Patient received post-splenectomy vaccines on day of discharge. Patient developed some acute hypoxic respiratory failure postoperatively, compounded by the chronic emphysematous changes noted on CT  scan. He was weaned from supplemental oxygen but still required about 2L via nasal canula at time of discharge therefore he was set up for home supplemental oxygen. On POD8, the patient was voiding well, tolerating diet, ambulating well, pain well controlled, vital signs stable, incisions c/d/i and felt stable for discharge home.  Patient will follow up as below and knows to call with questions or concerns.      Allergies as of 06/12/2020      Reactions   Lactose Intolerance (gi) Diarrhea   Also Flatulence      Medication List    STOP taking these medications   ibuprofen 200 MG tablet Commonly known as: ADVIL     TAKE these medications   acetaminophen 500 MG tablet Commonly known as: TYLENOL Take 1,000 mg by mouth every 6 (six) hours as needed for mild pain.   Ensure Max Protein Liqd Take 330 mLs (11 oz total) by mouth 3 (three) times daily between meals.   feeding supplement (PROSource TF) liquid Place 45 mLs into feeding tube 3 (three) times daily for 14 days.   feeding supplement (OSMOLITE 1.5 CAL) Liqd Osmolite 1.5 @ 85 ml/hr x 14 hrs (1700-0700) via J-tube Notes to patient: Take as directed at night   Ex-Lax 15 MG Tabs Generic drug: Sennosides Take 1 tablet by mouth as needed (constipation).   methocarbamol 750 MG tablet Commonly known as: ROBAXIN Take 1 tablet (750 mg total) by mouth every 8 (eight) hours as needed for muscle spasms.   oxyCODONE 5 MG immediate release tablet Commonly known as: Oxy IR/ROXICODONE Take 1 tablet (5 mg total) by mouth every 6 (six) hours as needed for severe pain.   pantoprazole 40 MG tablet Commonly known as: PROTONIX Take 1 tablet (40  mg total) by mouth 2 (two) times daily.   simethicone 80 MG chewable tablet Commonly known as: MYLICON Chew 80 mg by mouth every 6 (six) hours as needed for flatulence.         Follow-up Information    Stechschulte, Hyman Hopes, MD. Go on 06/22/2020.   Specialty: Surgery Why: Follow up  appointment scheduled for 4:30 PM. Please arrive 30 min prior to appointment time for check in. Please bring photo ID and any insurance information with you.  Contact information: [address] [place] [address] [phone]               Signed: Franne Forts, [facility] 06/13/2020, 3:42 PM Please see Amion for pager number during day hours 7:00am-4:30pm

## 2020-06-21 LAB — LIPASE, FLUID: Lipase-Fluid: 15 U/L

## 2021-09-27 DIAGNOSIS — Z139 Encounter for screening, unspecified: Secondary | ICD-10-CM

## 2021-09-27 LAB — GLUCOSE, POCT (MANUAL RESULT ENTRY): POC Glucose: 123 mg/dl — AB (ref 70–99)

## 2021-09-27 NOTE — Congregational Nurse Program (Signed)
?Dept: [phone] ? ? ?Congregational Nurse Program Note ? ?Date of Encounter: 09/27/2021 ? ?Past Medical History: ?No past medical history on file. ? ?Encounter Details: ? CNP Questionnaire - 09/27/21 1117   ? ?  ? Questionnaire  ? Do you give verbal consent to treat you today? Yes   ? Location Patient [place]   ? Visit Setting Other   ? Patient Status Unknown   ? Insurance Uninsured ([place] [employer]/Self-Pay)   ? Insurance Referral [employer]   ? Medication Have Medication Insecurities;Referred to Medication Assistance   ? Medical Provider No   ? Screening Referrals N/A   ? Medical Referral [facility]   ? Medical Appointment Made Non-[facility] PCP/clinic   ? Food Have Food Insecurities;Referred to Food Pantry   ? Transportation Provided transportation assistance;Need transportation assistance   ? Housing/Utilities N/A   ? Interpersonal Safety N/A   ? Intervention Blood glucose;Advocate;Case Management;Counsel;Educate;Support   ? ED Visit Averted N/A   ? Life-Saving Intervention Made N/A   ? ?  ?  ? ?  ? ? ? ? ?Dept: [phone] ? ? ?Congregational Nurse Program Note ? ?Date of Encounter: 09/27/2021 ? ?Past Medical History: ?No past medical history on file. ? ?Encounter Details: ? CNP Questionnaire - 09/27/21 1117   ? ?  ? Questionnaire  ? Do you give verbal consent to treat you today? Yes   ? Location Patient [place]   ? Visit Setting Other   ? Patient Status Unknown   ? Insurance Uninsured ([place] [employer]/Self-Pay)   ? Insurance Referral [employer]   ? Medication Have Medication Insecurities;Referred to Medication Assistance   ? Medical Provider No   ? Screening Referrals N/A   ? Medical Referral [facility]   ? Medical Appointment Made [facility]CP[facility]cl[facility]   ? Food Have Food Insecurities;Referred to Food Pantry   ? Transportation Provided transportation assistance;Need transportation assistance   ?  Housing/Utilities N/A   ? Interpersonal Safety N/A   ? Intervention Blood glucose;Advocate;Case Management;Counsel;Educate;Support   ? ED Visit Averted N/A   ? Life-Saving Intervention Made N/A   ? ?  ?  ? ?  ? ? ? ? ?Dept: [phone] ? ? ?Congregational Nurse Program Note ? ?Date of Encounter: 09/27/2021 ? ?Past Medical History: ?No past medical history on file. ? ?Encounter Details: ? CNP Questionnaire - 09/27/21 1117   ? ?  ? Questionnaire  ? Do you give verbal consent to treat you today? Yes   ? Location Patient [place]   ? Visit Setting Other   ? Patient Status Unknown   ? Insurance Uninsured ([place] [employer]/Self-Pay)   ? Insurance Referral [employer]   ? Medication Have Medication Insecurities;Referred to Medication Assistance   ? Medical Provider No   ? Screening Referrals N/A   ? Medical Referral [facility]   ? Medical Appointment Made [facility]cl[facility]   ? Food Have Food Insecurities;Referred to Food Pantry   ? Transportation Provided transportation assistance;Need transportation assistance   ? Housing/Utilities N/A   ? Interpersonal Safety N/A   ? Intervention Blood glucose;Advocate;Case Management;Counsel;Educate;Support   ? ED Visit Averted N/A   ? Life-Saving Intervention Made N/A   ? ?  ?  ? ?  ? ? ?Client in to Eddie Morrow to enroll in [facility] [place] today, referred by former [employer] employee. Client is married and lives with his wife Eddie Morrow. He works  part-time at [employer]. He would like to work full time, but states his feet are both numb, but the right starts bothering him more after a few hours. ?Transportation: Uses the [employer] bus  ?Disability: He has applied for Disability and is awaiting hearing which per client could be in July this year. ?His wife is not employed. ? ?Chief complaint today is the numbness and tingling of both feet, with Right foot being worse. Client does use a cane due to this numbness. He denies and back injury.  Family history unknown due to being adopted. 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 All notes within in chart in Epic. ? ?Current Medications:  ?Tylenol as needed for pain ?Gax relief medication as needed for Gas pains ?Has not taken any medications prescribed since discharge as he could not afford them per client. ? ?NKDA ? ?Alert and oriented to person,place and time. Answers questions appropriately,  very pleasant.  ?PHQ-9 today was scored as 8 ?Little interest in doing things: more than half the days ?Feeling down, depressed or hopeless: several days ?Trouble falling asleep or staying asleep : nearly every day (reports sleeps 2 hours up 2 hours ) ?Feeling tired or having little energy: several days ?Feeling bad about self or tat you are a failure or have let people down: several days ? ?Client reports most of these issues developed since his  ?hospitalization in 2021 ?Positive for vision changes does wear glasses currently ?Denies Chest pains, does report some shortness of breath at times, states history of emphysema but on no medications, states when he has shortness of breath uses his wife's inhaler.  ?Denies shortness of breath at present. Oxygen saturation 98% ?Respiration even and unlabored at 14, speaking in full sentences without difficulty. ? ?Blood pressure today left Arm sitting , normal cuff 171/93 pulse  62, right arm sitting, normal cuff 161/93  ?Gait uses cane on right side with walking, gait slow  ?Complains of bilateral numbness to feet, but worse on the right, with feeling "Hot at times and tingling" No history of diabetes to his knowledge. Denies excessive thirst or urination, appetite good and reports has gained some weight up to 124.4 lbs today ? ?Random glucose last had broth this am. Fingerstick 123 ?Everyday smoker 1/2 ppd. Counseled client on working toward quitting and the affects quitting would help  with shortness of breath as well as blood pressure. ? ?Abdomen positive for healed surgical scars and scar from past feeding tube. Client reports he east small meals he does get full quicker and complains of gas with eating. His complaint also is of pain when lying flat at night from his surgery "feels like it is pulling". ? ?[facility]. Appointment secured for 10/11/21 at 0930 ? ?Food stamps: client met with Namon Cirri MSW intern today to attempt to begin food stamp application. Website down. ?Client and wife shopped [employer] today. Client given a [employer] pass to use to get to his appointment. He is very familiar with skat buses and rides those to work and back.  ?Educated on lowering salt intake and how smoking affects blood pressure and to consider cutting back and smoking cessation for his health. ?Discussed that I will plan to follow up with him by phone [facility]. ? ?Debria Garret RN ?[employer]. ?

## 2021-10-11 ENCOUNTER — Ambulatory Visit: Payer: Self-pay | Admitting: Physician Assistant

## 2021-10-11 ENCOUNTER — Encounter: Payer: Self-pay | Admitting: Physician Assistant

## 2021-10-11 ENCOUNTER — Other Ambulatory Visit: Payer: Self-pay | Admitting: Physician Assistant

## 2021-10-11 VITALS — BP 128/78 | HR 61 | Temp 97.3°F | Ht 68.0 in | Wt 120.0 lb

## 2021-10-11 DIAGNOSIS — Z131 Encounter for screening for diabetes mellitus: Secondary | ICD-10-CM

## 2021-10-11 DIAGNOSIS — K8681 Exocrine pancreatic insufficiency: Secondary | ICD-10-CM

## 2021-10-11 DIAGNOSIS — Z125 Encounter for screening for malignant neoplasm of prostate: Secondary | ICD-10-CM

## 2021-10-11 DIAGNOSIS — Z1211 Encounter for screening for malignant neoplasm of colon: Secondary | ICD-10-CM

## 2021-10-11 DIAGNOSIS — Z7689 Persons encountering health services in other specified circumstances: Secondary | ICD-10-CM

## 2021-10-11 DIAGNOSIS — Z9081 Acquired absence of spleen: Secondary | ICD-10-CM

## 2021-10-11 DIAGNOSIS — Z90411 Acquired partial absence of pancreas: Secondary | ICD-10-CM

## 2021-10-11 DIAGNOSIS — F17200 Nicotine dependence, unspecified, uncomplicated: Secondary | ICD-10-CM

## 2021-10-11 DIAGNOSIS — R200 Anesthesia of skin: Secondary | ICD-10-CM

## 2021-10-11 DIAGNOSIS — Z13220 Encounter for screening for lipoid disorders: Secondary | ICD-10-CM

## 2021-10-11 NOTE — Patient Instructions (Signed)
Long-Term Care After a Splenectomy ?A splenectomy is surgery to remove a diseased or injured spleen. The spleen is an organ that is located in the upper left part of the abdomen, just under the ribs. The spleen filters and cleans the blood. It also stores blood cells and destroys cells that are old. ?The spleen, along with other body systems and organs, plays an important role in the body's natural disease-fighting system (immune system). Not having a spleen may affect your body's ability to fight infections. After the spleen is removed, you have a slightly greater chance of developing a serious, life-threatening infection. The following are some actions that you can take to prevent infection. ?How to prevent an infection ?Your health care provider will recommend actions to help prevent infection. These may include: ?Making sure that your immunizations are up to date, including: ?Pneumococcal. ?Seasonal flu (influenza). ?Hib (Haemophilus influenzae type b). ?Meningitis. ?Making sure that vaccines are up to date for your family members. ?Following good daily practices to prevent infection, such as: ?Washing your hands often, especially before and after preparing food, eating, changing diapers, and playing with children or animals. ?Disinfecting surfaces regularly. ?Avoiding people who have active illness or infections. ?Taking precautions to avoid insect bites, such as: ?Wearing proper clothing that covers the entire body when you are in wooded or marshy areas. ?Changing clothing right away and checking for bites after you have been outside. ?Using insect spray. ?Using insect netting. ?Staying indoors during hours when mosquitoes are most active. ?Taking precautions to avoid dog bites. After a splenectomy, you may be at increased risk for rare infections that are associated with dog bites. ?How to prepare for travel ?If you travel in the [place], take actions to avoid insect bites, especially in [place] and  [place] areas. Insects can carry many viruses, and you may be at an increased risk of becoming sick from these viruses. You should also take precautions if you travel abroad to places where malaria is common. In that case, follow these guidelines: ?Contact your health care provider to get specific advice about the places that you will be visiting. ?Get specific immunizations to guard against the disease risks in the country that you will be visiting. ?Understand how to prevent infections, such as malaria, while you are abroad. These infections can pose serious risk. Precautions may include: ?Taking daily tablets to prevent malaria. ?Taking other precautions to prevent insect bites. ?Bring broad-spectrum antibiotic medicines with you if they have been prescribed. ?Follow these instructions at home: ?Medicines ? ?Take over-the-counter and prescription medicines only as told by your health care provider. ?If you were prescribed an antibiotic medicine: ?Take it as told by your health care provider. ?Do not stop using the antibiotic even if you start to feel better. ?Talk with your health care provider about using a probiotic supplement to prevent stomach upset. ?Keep track of medicine refills so you do not run out of medicine. ?General instructions ?Always tell your health care providers that you do not have a spleen before you have any procedures. These include medical and dental procedures. ?Inform your close contacts of your condition. Consider wearing a medical alert bracelet or carrying an ID card. ?Keep all follow-up visits. This is important. ?Contact a health care provider if: ?You have a fever. ?You have signs of infection that continue after taking an antibiotic. Signs may include a fever, chills, and feeling unwell. ?You are considering travel abroad. ?You are bitten by a tick or a dog. ?Get  help right away if: ?You have chest pain along with: ?Shortness of breath. ?Pain in the back, neck, or  jaw. ?You have pain or swelling in your leg. ?You develop a sudden headache and dizziness. ?These symptoms may represent a serious problem that is an emergency. Do not wait to see if the symptoms will go away. Get medical help right away. Call your local emergency services (911 in the [place] Do not drive yourself to the hospital. ?Summary ?The spleen plays an important role in fighting disease and infections. If you have had your spleen surgically removed, you should take steps to help prevent infections. ?Make sure that your vaccinations are up to date. ?Follow good daily practices to prevent infection, such as washing your hands often and avoiding people who are sick. ?Make sure that your family members, others who are close to you, and all of your health care providers know that your spleen has been removed. ?Contact a health care provider if you have a fever or any signs of infection. ?This information is not intended to replace advice given to you by your health care provider. Make sure you discuss any questions you have with your health care provider. ?Document Revised: 11/22/2020 Document Reviewed: 11/22/2020 ?Elsevier Patient Education ? [place]. ? ?

## 2021-10-11 NOTE — Progress Notes (Signed)
? ?BP 128/78   Pulse 61   Temp (!) 97.3 ?F (36.3 ?C)   Ht 5\' 8"  (1.727 m)   Wt 120 lb (54.4 kg)   SpO2 98%   BMI 18.25 kg/m?   ? ?Subjective:  ? ? Patient ID: Eddie Morrow, male    DOB: 12-31-1970, 51 y.o.   MRN: [id number] ? ?HPI: ?Eddie Morrow is a 51 y.o. male presenting on 10/11/2021 for New Patient (Initial Visit) ? ? ?HPI ? ?Pt is 50yoM who presents as a new patient.  He has not had a PCP in many years. ? ?He Works part-time at 10/13/2021 ? ?He has a lot of problems with his stomach and both feet.  His feet feel tinglnig and feel numb at times.   His right foot more so than the left. ? ?He says his stomach gets tight at times.  It gets bloated most of the time.  Pt had total splenectomy, partial gastrectomy, distal pancreatomy in 2021.   All his GI issues started after his surgery.  His feet issues may have started when he fell about 8 feet off of stairs that didn't have a rail (which is the injury that caused him to have the surgery).  He has been treating his GI issues with lactose intolerance products but he continues to have the same problems anyway.   ? ?He is Not covid vaccinated ? ?He has not gotten any immunizations being s/p splenectomy. ? ?He says he weighed 140 lb before the fall/injury in 2021.   ? ? ? ? ?Relevant past medical, surgical, family and social history reviewed and updated as indicated. Interim medical history since our last visit reviewed. ?Allergies and medications reviewed and updated. ? ? ?Current Outpatient Medications:  ?  acetaminophen (TYLENOL) 500 MG tablet, Take 1,000 mg by mouth every 6 (six) hours as needed for mild pain., Disp: , Rfl:  ?  bismuth subsalicylate (PEPTO BISMOL) 262 MG/15ML suspension, Take 30 mLs by mouth every 6 (six) hours as needed., Disp: , Rfl:  ?  Ensure Max Protein (ENSURE MAX PROTEIN) LIQD, Take 330 mLs (11 oz total) by mouth 3 (three) times daily between meals., Disp: , Rfl:  ?  Glucerna (GLUCERNA) LIQD, Take 237 mLs by mouth., Disp: , Rfl:  ?   Lactase (LACTAID PO), Take by mouth., Disp: , Rfl:  ?  simethicone (MYLICON) 80 MG chewable tablet, Chew 80 mg by mouth every 6 (six) hours as needed for flatulence., Disp: , Rfl:  ? ? ? ?Review of Systems ? ?Per HPI unless specifically indicated above ? ?   ?Objective:  ?  ?BP 128/78   Pulse 61   Temp (!) 97.3 ?F (36.3 ?C)   Ht 5\' 8"  (1.727 m)   Wt 120 lb (54.4 kg)   SpO2 98%   BMI 18.25 kg/m?   ?Wt Readings from Last 3 Encounters:  ?10/11/21 120 lb (54.4 kg)  ?09/27/21 124 lb 6.4 oz (56.4 kg)  ?06/12/20 110 lb 8 oz (50.1 kg)  ?  ?Physical Exam ?Vitals reviewed.  ?Constitutional:   ?   General: He is not in acute distress. ?   Appearance: He is underweight. He is not toxic-appearing.  ?   Comments: Overall is slumped and hunched (to try to relieve pressure and tightness of abdomen)  ?HENT:  ?   Head: Normocephalic and atraumatic.  ?   Right Ear: External ear normal. There is impacted cerumen.  ?   Left Ear: External ear normal. There  is impacted cerumen.  ?Eyes:  ?   Extraocular Movements: Extraocular movements intact.  ?   Conjunctiva/sclera: Conjunctivae normal.  ?   Pupils: Pupils are equal, round, and reactive to light.  ?Neck:  ?   Thyroid: No thyromegaly.  ?Cardiovascular:  ?   Rate and Rhythm: Normal rate and regular rhythm.  ?   Pulses:     ?     Dorsalis pedis pulses are 1+ on the right side and 2+ on the left side.  ?     Posterior tibial pulses are 1+ on the right side and 2+ on the left side.  ?Pulmonary:  ?   Effort: Pulmonary effort is normal.  ?   Breath sounds: Normal breath sounds. No wheezing or rales.  ?Abdominal:  ?   General: Bowel sounds are normal.  ?   Palpations: Abdomen is soft. There is no mass.  ?   Tenderness: There is generalized abdominal tenderness. There is no guarding or rebound.  ?   Comments: Well-healed surgical scars.     ?Musculoskeletal:  ?   Cervical back: Neck supple. No tenderness or bony tenderness.  ?   Thoracic back: No tenderness or bony tenderness.  ?   Lumbar  back: No tenderness or bony tenderness.  ?   Right lower leg: No edema.  ?   Left lower leg: No edema.  ?Lymphadenopathy:  ?   Cervical: No cervical adenopathy.  ?Skin: ?   General: Skin is warm and dry.  ?   Findings: No rash.  ?Neurological:  ?   Mental Status: He is alert and oriented to person, place, and time.  ?   Motor: No weakness or tremor.  ?   Deep Tendon Reflexes:  ?   Reflex Scores: ?     Patellar reflexes are 2+ on the right side and 2+ on the left side. ?Psychiatric:     ?   Behavior: Behavior normal.  ? ? ? ? ? ? ?   ?Assessment & Plan:  ? ?Encounter Diagnoses  ?Name Primary?  ? Encounter to establish care Yes  ? S/P splenectomy   ? History of partial pancreatectomy   ? Tobacco use disorder   ? Exocrine pancreatic insufficiency   ? Screening for colon cancer   ? Screening cholesterol level   ? Screening for diabetes mellitus   ? Screening for prostate cancer   ? Numbness and tingling of both feet   ? ? ? ?-Pt educated and encouraged to get covid vaccination ?-pt was given reading information on post-spelenctomy ?-this office Will check on pneumonia vaccination PAP ?-Likely pancreatic insuf- Pt Assistance Program application for creon is completed and will be faxed off ?-pt to Get baseline labs ?-pt is given Cafa ?-order ABI ?-pt was given FIT test for colon cancer screening ?-pt is encouraged to stop Smoking  ?-will plan to refer for  PT/OT after starting creon and getting cafa ?-pt to follow up 3-4 wk.  He is to contact office sooner prn ? ? ? ? ?

## 2021-10-17 ENCOUNTER — Other Ambulatory Visit: Payer: Self-pay | Admitting: Physician Assistant

## 2021-10-17 DIAGNOSIS — R200 Anesthesia of skin: Secondary | ICD-10-CM

## 2021-10-18 ENCOUNTER — Other Ambulatory Visit (HOSPITAL_COMMUNITY)
Admission: RE | Admit: 2021-10-18 | Discharge: 2021-10-18 | Disposition: A | Discharge status: Home / Self Care | Payer: Self-pay | Source: Ambulatory Visit | Attending: Physician Assistant | Admitting: Physician Assistant

## 2021-10-18 DIAGNOSIS — Z90411 Acquired partial absence of pancreas: Secondary | ICD-10-CM | POA: Insufficient documentation

## 2021-10-18 DIAGNOSIS — Z131 Encounter for screening for diabetes mellitus: Secondary | ICD-10-CM | POA: Insufficient documentation

## 2021-10-18 DIAGNOSIS — Z125 Encounter for screening for malignant neoplasm of prostate: Secondary | ICD-10-CM | POA: Insufficient documentation

## 2021-10-18 DIAGNOSIS — Z13220 Encounter for screening for lipoid disorders: Secondary | ICD-10-CM | POA: Insufficient documentation

## 2021-10-18 DIAGNOSIS — Z9081 Acquired absence of spleen: Secondary | ICD-10-CM | POA: Insufficient documentation

## 2021-10-18 LAB — COMPREHENSIVE METABOLIC PANEL
ALT: 16 U/L (ref 0–44)
AST: 22 U/L (ref 15–41)
Albumin: 4.3 g/dL (ref 3.5–5.0)
Alkaline Phosphatase: 82 U/L (ref 38–126)
Anion gap: 6 (ref 5–15)
BUN: 13 mg/dL (ref 6–20)
CO2: 28 mmol/L (ref 22–32)
Calcium: 9.6 mg/dL (ref 8.9–10.3)
Chloride: 101 mmol/L (ref 98–111)
Creatinine, Ser: 0.73 mg/dL (ref 0.61–1.24)
GFR, Estimated: >60 mL/min (ref >60)
Glucose, Bld: 100 mg/dL — ABNORMAL HIGH (ref 70–99)
Potassium: 4.8 mmol/L (ref 3.5–5.1)
Sodium: 135 mmol/L (ref 135–145)
Total Bilirubin: 0.8 mg/dL (ref 0.3–1.2)
Total Protein: 8.1 g/dL (ref 6.5–8.1)

## 2021-10-18 LAB — CBC
HCT: 47.1 % (ref 39.0–52.0)
Hemoglobin: 15.9 g/dL (ref 13.0–17.0)
MCH: 34.3 pg — ABNORMAL HIGH (ref 26.0–34.0)
MCHC: 33.8 g/dL (ref 30.0–36.0)
MCV: 101.7 fL — ABNORMAL HIGH (ref 80.0–100.0)
Platelets: 285 10*3/uL (ref 150–400)
RBC: 4.63 MIL/uL (ref 4.22–5.81)
RDW: 15.9 % — ABNORMAL HIGH (ref 11.5–15.5)
WBC: 7.2 10*3/uL (ref 4.0–10.5)
nRBC: 0 % (ref 0.0–0.2)

## 2021-10-18 LAB — POC FIT TEST STOOL: Fecal Occult Blood: NEGATIVE

## 2021-10-18 LAB — LIPID PANEL
Cholesterol: 239 mg/dL — ABNORMAL HIGH (ref 0–200)
HDL: 65 mg/dL (ref >40)
LDL Cholesterol: 155 mg/dL — ABNORMAL HIGH (ref 0–99)
Total CHOL/HDL Ratio: 3.7 RATIO
Triglycerides: 95 mg/dL (ref <150)
VLDL: 19 mg/dL (ref 0–40)

## 2021-10-18 LAB — HEMOGLOBIN A1C
Hgb A1c MFr Bld: 5.4 % (ref 4.8–5.6)
Mean Plasma Glucose: 108.28 mg/dL

## 2021-10-18 LAB — PSA: Prostatic Specific Antigen: 0.4 ng/mL (ref 0.00–4.00)

## 2021-10-20 ENCOUNTER — Ambulatory Visit (HOSPITAL_COMMUNITY): Admission: RE | Admit: 2021-10-20 | Payer: Self-pay | Source: Ambulatory Visit

## 2021-11-01 ENCOUNTER — Encounter: Payer: Self-pay | Admitting: Physician Assistant

## 2021-11-01 ENCOUNTER — Ambulatory Visit: Payer: Self-pay | Admitting: Physician Assistant

## 2021-11-02 ENCOUNTER — Ambulatory Visit (HOSPITAL_COMMUNITY): Payer: Self-pay

## 2021-11-15 ENCOUNTER — Encounter: Payer: Self-pay | Admitting: Physician Assistant

## 2021-11-15 ENCOUNTER — Ambulatory Visit: Payer: Self-pay | Admitting: Physician Assistant

## 2021-11-15 VITALS — BP 126/80 | HR 61 | Temp 97.0°F | Ht 68.0 in | Wt 127.0 lb

## 2021-11-15 DIAGNOSIS — R200 Anesthesia of skin: Secondary | ICD-10-CM

## 2021-11-15 DIAGNOSIS — E785 Hyperlipidemia, unspecified: Secondary | ICD-10-CM

## 2021-11-15 DIAGNOSIS — R29898 Other symptoms and signs involving the musculoskeletal system: Secondary | ICD-10-CM

## 2021-11-15 DIAGNOSIS — Z90411 Acquired partial absence of pancreas: Secondary | ICD-10-CM

## 2021-11-15 DIAGNOSIS — Z9081 Acquired absence of spleen: Secondary | ICD-10-CM

## 2021-11-15 DIAGNOSIS — F17200 Nicotine dependence, unspecified, uncomplicated: Secondary | ICD-10-CM

## 2021-11-15 DIAGNOSIS — K8681 Exocrine pancreatic insufficiency: Secondary | ICD-10-CM

## 2021-11-15 MED ORDER — ATORVASTATIN CALCIUM 20 MG PO TABS: 20.0000 mg | ORAL_TABLET | Freq: Every day | ORAL | 1 refills | Status: DC

## 2021-11-15 NOTE — Progress Notes (Signed)
BP 126/80   Pulse 61   Temp (!) 97 F (36.1 C)   Ht 5\' 8"  (1.727 m)   Wt 127 lb (57.6 kg)   SpO2 99%   BMI 19.31 kg/m    Subjective:    Patient ID: Eddie Morrow, male    DOB: 20-Dec-1970, 51 y.o.   MRN: [id number]  HPI: Santhosh Gulino is a 51 y.o. male presenting on 11/15/2021 for Follow-up   HPI  Pt is 50yoM who presents for follow up of his new patient appointment on 10/11/21.  He says he is doing so much better since starting on the creon.  He is taking it qid.  He has gained 7 pounds and feels better.     He still has pain where they took his spleen out.  He takes APAP prn for this pain.  He put his cafa/[employer] financial assistance application in the mail.  His abi was cancelled due to his phone wasn't working/couldn't contact him to notify of appointment.  His phone number has since been updated.   He is Still working at 10/13/21.  He is Still smoking.  He did not get the covid vaccination    Relevant past medical, surgical, family and social history reviewed and updated as indicated. Interim medical history since our last visit reviewed. Allergies and medications reviewed and updated.    Current Outpatient Medications:    acetaminophen (TYLENOL) 500 MG tablet, Take 1,000 mg by mouth every 6 (six) hours as needed for mild pain., Disp: , Rfl:    Ensure Max Protein (ENSURE MAX PROTEIN) LIQD, Take 330 mLs (11 oz total) by mouth 3 (three) times daily between meals., Disp: , Rfl:    Glucerna (GLUCERNA) LIQD, Take 237 mLs by mouth., Disp: , Rfl:    Pancrelipase, Lip-Prot-Amyl, 24000-76000 units CPEP, Take 1 capsule by mouth in the morning, at noon, in the evening, and at bedtime., Disp: , Rfl:    simethicone (MYLICON) 80 MG chewable tablet, Chew 80 mg by mouth every 6 (six) hours as needed for flatulence., Disp: , Rfl:    Review of Systems  Per HPI unless specifically indicated above     Objective:    BP 126/80   Pulse 61   Temp (!) 97 F (36.1 C)   Ht  5\' 8"  (1.727 m)   Wt 127 lb (57.6 kg)   SpO2 99%   BMI 19.31 kg/m   Wt Readings from Last 3 Encounters:  11/15/21 127 lb (57.6 kg)  10/11/21 120 lb (54.4 kg)  09/27/21 124 lb 6.4 oz (56.4 kg)    Physical Exam Vitals reviewed.  Constitutional:      General: He is not in acute distress.    Appearance: He is well-developed. He is ill-appearing. He is not toxic-appearing.  HENT:     Head: Normocephalic and atraumatic.  Cardiovascular:     Rate and Rhythm: Normal rate and regular rhythm.  Pulmonary:     Effort: Pulmonary effort is normal.     Breath sounds: Normal breath sounds. No wheezing.  Abdominal:     General: Bowel sounds are normal.     Palpations: Abdomen is soft.     Tenderness: There is no abdominal tenderness.     Comments: Well-healed surgical scars  Musculoskeletal:     Cervical back: Neck supple.     Right lower leg: No edema.     Left lower leg: No edema.  Lymphadenopathy:     Cervical: No cervical adenopathy.  Skin:    General: Skin is warm and dry.  Neurological:     Mental Status: He is alert and oriented to person, place, and time.  Psychiatric:        Behavior: Behavior normal.    Results for orders placed or performed during the hospital encounter of 10/18/21  PSA  Result Value Ref Range   Prostatic Specific Antigen 0.40 0.00 - 4.00 ng/mL  Hemoglobin A1c  Result Value Ref Range   Hgb A1c MFr Bld 5.4 4.8 - 5.6 %   Mean Plasma Glucose 108.28 mg/dL  CBC  Result Value Ref Range   WBC 7.2 4.0 - 10.5 K/uL   RBC 4.63 4.22 - 5.81 MIL/uL   Hemoglobin 15.9 13.0 - 17.0 g/dL   HCT 62.1 30.8 - 65.7 %   MCV 101.7 (H) 80.0 - 100.0 fL   MCH 34.3 (H) 26.0 - 34.0 pg   MCHC 33.8 30.0 - 36.0 g/dL   RDW 84.6 (H) 96.2 - 95.2 %   Platelets 285 150 - 400 K/uL   nRBC 0.0 0.0 - 0.2 %  Comprehensive metabolic panel  Result Value Ref Range   Sodium 135 135 - 145 mmol/L   Potassium 4.8 3.5 - 5.1 mmol/L   Chloride 101 98 - 111 mmol/L   CO2 28 22 - 32 mmol/L    Glucose, Bld 100 (H) 70 - 99 mg/dL   BUN 13 6 - 20 mg/dL   Creatinine, Ser 8.41 0.61 - 1.24 mg/dL   Calcium 9.6 8.9 - 32.4 mg/dL   Total Protein 8.1 6.5 - 8.1 g/dL   Albumin 4.3 3.5 - 5.0 g/dL   AST 22 15 - 41 U/L   ALT 16 0 - 44 U/L   Alkaline Phosphatase 82 38 - 126 U/L   Total Bilirubin 0.8 0.3 - 1.2 mg/dL   GFR, Estimated >40 >10 mL/min   Anion gap 6 5 - 15  Lipid panel  Result Value Ref Range   Cholesterol 239 (H) 0 - 200 mg/dL   Triglycerides 95 <272 mg/dL   HDL 65 >53 mg/dL   Total CHOL/HDL Ratio 3.7 RATIO   VLDL 19 0 - 40 mg/dL   LDL Cholesterol 664 (H) 0 - 99 mg/dL      Assessment & Plan:   Encounter Diagnoses  Name Primary?   Exocrine pancreatic insufficiency Yes   S/P splenectomy    Numbness and tingling of both feet    Tobacco use disorder    History of partial pancreatectomy    Hyperlipidemia, unspecified hyperlipidemia type    Muscular deconditioning       -Reviewed labs with pt -Start atorvastin and lowfat diet -Refer for PT -pt to Continue creon.  Encouraged healthy eating -ABI was re-scheduled -pt was educated and encouraged to get Covid vaccination.  Discussed wih pt that this office will be looking for vaccines that he needs as asplenic and he should do his part by getting the covid vaccination.   Pt is encouraged to bring any vaccination information he has.  Will be looking for PAP for pneumonia, menigitits, hib.   F/u 1 month to check EPI and review ABI results.  He is to contact office sooner prn

## 2021-11-15 NOTE — Patient Instructions (Signed)

## 2021-11-22 ENCOUNTER — Other Ambulatory Visit (HOSPITAL_COMMUNITY): Payer: Self-pay

## 2021-11-29 ENCOUNTER — Ambulatory Visit (HOSPITAL_COMMUNITY): Payer: Self-pay

## 2021-12-13 ENCOUNTER — Ambulatory Visit (HOSPITAL_COMMUNITY)
Admission: RE | Admit: 2021-12-13 | Discharge: 2021-12-13 | Disposition: A | Discharge status: Home / Self Care | Payer: Self-pay | Source: Ambulatory Visit | Attending: Physician Assistant | Admitting: Physician Assistant

## 2021-12-13 ENCOUNTER — Encounter: Payer: Self-pay | Admitting: Physician Assistant

## 2021-12-13 ENCOUNTER — Ambulatory Visit: Payer: Self-pay | Admitting: Physician Assistant

## 2021-12-13 VITALS — BP 126/90 | HR 60 | Temp 96.3°F | Wt 124.6 lb

## 2021-12-13 DIAGNOSIS — R200 Anesthesia of skin: Secondary | ICD-10-CM | POA: Insufficient documentation

## 2021-12-13 DIAGNOSIS — Z90411 Acquired partial absence of pancreas: Secondary | ICD-10-CM

## 2021-12-13 DIAGNOSIS — E785 Hyperlipidemia, unspecified: Secondary | ICD-10-CM

## 2021-12-13 DIAGNOSIS — R202 Paresthesia of skin: Secondary | ICD-10-CM | POA: Insufficient documentation

## 2021-12-13 DIAGNOSIS — F17200 Nicotine dependence, unspecified, uncomplicated: Secondary | ICD-10-CM

## 2021-12-13 DIAGNOSIS — Z9081 Acquired absence of spleen: Secondary | ICD-10-CM

## 2021-12-13 DIAGNOSIS — R29898 Other symptoms and signs involving the musculoskeletal system: Secondary | ICD-10-CM

## 2021-12-13 DIAGNOSIS — K8681 Exocrine pancreatic insufficiency: Secondary | ICD-10-CM | POA: Insufficient documentation

## 2021-12-13 NOTE — Progress Notes (Signed)
BP 126/90   Pulse 60   Temp (!) 96.3 F (35.7 C)   Wt 124 lb 9.6 oz (56.5 kg)   SpO2 98%   BMI 18.95 kg/m    Subjective:    Patient ID: Eddie Morrow, male    DOB: 1971/06/22, 51 y.o.   MRN: [id number]  HPI: Eddie Morrow is a 51 y.o. male presenting on 12/13/2021 for No chief complaint on file.   HPI  Pt is 50yoM with PCI.  He says He is getting more strength.   He is still smoking.  He has ABI today.  He has been called about Phyical Therapy but he hasn't returned the voicemail yet.  He is turning in the last of his paperwork for cafa today      Relevant past medical, surgical, family and social history reviewed and updated as indicated. Interim medical history since our last visit reviewed. Allergies and medications reviewed and updated.   Current Outpatient Medications:    acetaminophen (TYLENOL) 500 MG tablet, Take 1,000 mg by mouth every 6 (six) hours as needed for mild pain., Disp: , Rfl:    atorvastatin (LIPITOR) 20 MG tablet, Take 1 tablet (20 mg total) by mouth daily., Disp: 90 tablet, Rfl: 1   Ensure Max Protein (ENSURE MAX PROTEIN) LIQD, Take 330 mLs (11 oz total) by mouth 3 (three) times daily between meals., Disp: , Rfl:    Glucerna (GLUCERNA) LIQD, Take 237 mLs by mouth., Disp: , Rfl:    Pancrelipase, Lip-Prot-Amyl, 24000-76000 units CPEP, Take 1 capsule by mouth in the morning, at noon, in the evening, and at bedtime., Disp: , Rfl:    simethicone (MYLICON) 80 MG chewable tablet, Chew 80 mg by mouth every 6 (six) hours as needed for flatulence., Disp: , Rfl:     Review of Systems  Per HPI unless specifically indicated above     Objective:    BP 126/90   Pulse 60   Temp (!) 96.3 F (35.7 C)   Wt 124 lb 9.6 oz (56.5 kg)   SpO2 98%   BMI 18.95 kg/m   Wt Readings from Last 3 Encounters:  12/13/21 124 lb 9.6 oz (56.5 kg)  11/15/21 127 lb (57.6 kg)  10/11/21 120 lb (54.4 kg)    Physical Exam Vitals reviewed.  Constitutional:       General: He is not in acute distress.    Appearance: He is underweight. He is not toxic-appearing.  HENT:     Head: Normocephalic and atraumatic.  Cardiovascular:     Rate and Rhythm: Normal rate and regular rhythm.  Pulmonary:     Effort: Pulmonary effort is normal.     Breath sounds: Normal breath sounds. No wheezing.  Abdominal:     General: Bowel sounds are normal.     Palpations: Abdomen is soft.     Tenderness: There is no abdominal tenderness.  Musculoskeletal:     Cervical back: Neck supple.     Right lower leg: No edema.  Lymphadenopathy:     Cervical: No cervical adenopathy.  Skin:    General: Skin is warm and dry.  Neurological:     Mental Status: He is alert and oriented to person, place, and time.  Psychiatric:        Attention and Perception: Attention normal.        Speech: Speech normal.        Behavior: Behavior normal. Behavior is cooperative.  Assessment & Plan:   Encounter Diagnoses  Name Primary?   Exocrine pancreatic insufficiency Yes   S/P splenectomy    Numbness and tingling of both feet    Tobacco use disorder    Hyperlipidemia, unspecified hyperlipidemia type    History of partial pancreatectomy    Muscular deconditioning      S/p splenectomy -pt signed Pt Assistance Program application  for tdap and meningococcal vaccinations today.  Having difficulty getting pneumonia vax.  He is encouraed to go ahead and get his covid vax  PCI -pt to continue with creon and healthy diet.  He is given some samples ensure pudding.  May consider increase in creon if weight not improved at next appointment  Tobacco abuse -counseled cessation.  Gave handouts on cessation including 1-800-quit and a local cessation class.  Discussed that smoking blunts appetite and further impairs his under-weight  Dyslipidemia -pt on atorvastatin.  Will recheck lipids before next appointment  Numbness/tinglins -ABI today as scheduled  Muscular  deconditioning -pt is encouraged to call physical therapy back to get scheduled  -pt to follow up 2 months.  He is to contact office sooner prn

## 2021-12-21 ENCOUNTER — Telehealth: Payer: Self-pay

## 2021-12-21 NOTE — Telephone Encounter (Signed)
Called to check in with client and his wife Kendal Hymen. He has been working on [employer] with Child psychotherapist. He states he will come into the office this afternoon around 2:30 to bring documents and shop food market.  Followed up regarding his wife Kendal Hymen who had a recent ER visit. She is also uninsured and no PCP. Discussed working on getting her enrolled today so that she can establish medical care and follow up.   Francee Nodal RN [employer]

## 2022-01-31 ENCOUNTER — Other Ambulatory Visit: Payer: Self-pay | Admitting: Physician Assistant

## 2022-01-31 DIAGNOSIS — E785 Hyperlipidemia, unspecified: Secondary | ICD-10-CM

## 2022-01-31 DIAGNOSIS — Z79899 Other long term (current) drug therapy: Secondary | ICD-10-CM

## 2022-02-07 ENCOUNTER — Other Ambulatory Visit (HOSPITAL_COMMUNITY)
Admission: RE | Admit: 2022-02-07 | Discharge: 2022-02-07 | Disposition: A | Discharge status: Home / Self Care | Payer: Self-pay | Source: Ambulatory Visit | Attending: Physician Assistant | Admitting: Physician Assistant

## 2022-02-07 DIAGNOSIS — E785 Hyperlipidemia, unspecified: Secondary | ICD-10-CM | POA: Insufficient documentation

## 2022-02-07 DIAGNOSIS — Z79899 Other long term (current) drug therapy: Secondary | ICD-10-CM | POA: Insufficient documentation

## 2022-02-07 LAB — HEPATIC FUNCTION PANEL
ALT: 37 U/L (ref 0–44)
AST: 54 U/L — ABNORMAL HIGH (ref 15–41)
Albumin: 4.3 g/dL (ref 3.5–5.0)
Alkaline Phosphatase: 91 U/L (ref 38–126)
Bilirubin, Direct: 0.1 mg/dL (ref 0.0–0.2)
Indirect Bilirubin: 0.6 mg/dL (ref 0.3–0.9)
Total Bilirubin: 0.7 mg/dL (ref 0.3–1.2)
Total Protein: 7.9 g/dL (ref 6.5–8.1)

## 2022-02-07 LAB — LIPID PANEL
Cholesterol: 167 mg/dL (ref 0–200)
HDL: 75 mg/dL (ref >40)
LDL Cholesterol: 72 mg/dL (ref 0–99)
Total CHOL/HDL Ratio: 2.2 ratio
Triglycerides: 100 mg/dL (ref <150)
VLDL: 20 mg/dL (ref 0–40)

## 2022-02-14 ENCOUNTER — Other Ambulatory Visit: Payer: Self-pay | Admitting: Physician Assistant

## 2022-02-14 ENCOUNTER — Ambulatory Visit: Payer: Self-pay | Admitting: Physician Assistant

## 2022-02-14 ENCOUNTER — Encounter: Payer: Self-pay | Admitting: Physician Assistant

## 2022-02-14 VITALS — BP 110/80 | HR 68 | Temp 96.1°F | Ht 68.0 in | Wt 123.0 lb

## 2022-02-14 DIAGNOSIS — Z9081 Acquired absence of spleen: Secondary | ICD-10-CM

## 2022-02-14 DIAGNOSIS — K8681 Exocrine pancreatic insufficiency: Secondary | ICD-10-CM

## 2022-02-14 DIAGNOSIS — F17200 Nicotine dependence, unspecified, uncomplicated: Secondary | ICD-10-CM

## 2022-02-14 DIAGNOSIS — E785 Hyperlipidemia, unspecified: Secondary | ICD-10-CM

## 2022-02-14 MED ORDER — CREON 24000-76000 UNITS PO CPEP: ORAL_CAPSULE | ORAL | 0 refills | Status: DC

## 2022-02-14 NOTE — Progress Notes (Signed)
BP 110/80   Pulse 68   Temp (!) 96.1 F (35.6 C)   Ht 5\' 8"  (1.727 m)   Wt 123 lb (55.8 kg)   SpO2 97%   BMI 18.70 kg/m    Subjective:    Patient ID: Eddie Morrow, male    DOB: 1970-10-12, 51 y.o.   MRN: [id number]  HPI: Eddie Morrow is a 51 y.o. male presenting on 02/14/2022 for EPI and Hyperlipidemia   HPI   Chief Complaint  Patient presents with   EPI   Hyperlipidemia    Pt says he is eating and is feeling much better but he still isn't able to gain weight.  He says he weighed 148 before his injury & surgery.  He is living in a tent in neighbor's back yard/he is homeless.   He is still working at 02/16/2022 part time.  He says he is cutting back on smoking and is down to 4/day.       Relevant past medical, surgical, family and social history reviewed and updated as indicated. Interim medical history since our last visit reviewed. Allergies and medications reviewed and updated.  Current Outpatient Medications:    acetaminophen (TYLENOL) 500 MG tablet, Take 1,000 mg by mouth every 6 (six) hours as needed for mild pain., Disp: , Rfl:    atorvastatin (LIPITOR) 20 MG tablet, Take 1 tablet (20 mg total) by mouth daily., Disp: 90 tablet, Rfl: 1   Ensure Max Protein (ENSURE MAX PROTEIN) LIQD, Take 330 mLs (11 oz total) by mouth 3 (three) times daily between meals., Disp: , Rfl:    Glucerna (GLUCERNA) LIQD, Take 237 mLs by mouth., Disp: , Rfl:    Pancrelipase, Lip-Prot-Amyl, 24000-76000 units CPEP, Take 1 capsule by mouth in the morning, at noon, in the evening, and at bedtime., Disp: , Rfl:    simethicone (MYLICON) 80 MG chewable tablet, Chew 80 mg by mouth every 6 (six) hours as needed for flatulence., Disp: , Rfl:      Review of Systems  Per HPI unless specifically indicated above     Objective:    BP 110/80   Pulse 68   Temp (!) 96.1 F (35.6 C)   Ht 5\' 8"  (1.727 m)   Wt 123 lb (55.8 kg)   SpO2 97%   BMI 18.70 kg/m   Wt Readings from Last 3  Encounters:  02/14/22 123 lb (55.8 kg)  12/13/21 124 lb 9.6 oz (56.5 kg)  11/15/21 127 lb (57.6 kg)    Physical Exam Vitals reviewed.  Constitutional:      General: He is not in acute distress.    Appearance: He is not toxic-appearing.  HENT:     Head: Normocephalic and atraumatic.  Cardiovascular:     Rate and Rhythm: Normal rate and regular rhythm.  Pulmonary:     Effort: Pulmonary effort is normal. No respiratory distress.     Breath sounds: No stridor. Wheezing present. No rhonchi or rales.     Comments: Soft scattered expiratory wheeze Abdominal:     General: Bowel sounds are normal.     Palpations: Abdomen is soft.     Tenderness: There is no abdominal tenderness.  Musculoskeletal:     Cervical back: Neck supple.     Right lower leg: No edema.     Left lower leg: No edema.  Lymphadenopathy:     Cervical: No cervical adenopathy.  Skin:    General: Skin is warm and dry.  Neurological:  Mental Status: He is alert and oriented to person, place, and time.  Psychiatric:        Behavior: Behavior normal.     Results for orders placed or performed during the hospital encounter of 02/07/22  Hepatic function panel  Result Value Ref Range   Total Protein 7.9 6.5 - 8.1 g/dL   Albumin 4.3 3.5 - 5.0 g/dL   AST 54 (H) 15 - 41 U/L   ALT 37 0 - 44 U/L   Alkaline Phosphatase 91 38 - 126 U/L   Total Bilirubin 0.7 0.3 - 1.2 mg/dL   Bilirubin, Direct 0.1 0.0 - 0.2 mg/dL   Indirect Bilirubin 0.6 0.3 - 0.9 mg/dL  Lipid panel  Result Value Ref Range   Cholesterol 167 0 - 200 mg/dL   Triglycerides 604 <540 mg/dL   HDL 75 >98 mg/dL   Total CHOL/HDL Ratio 2.2 RATIO   VLDL 20 0 - 40 mg/dL   LDL Cholesterol 72 0 - 99 mg/dL      Assessment & Plan:   Encounter Diagnoses  Name Primary?   Exocrine pancreatic insufficiency Yes   Hyperlipidemia, unspecified hyperlipidemia type    S/P splenectomy    Tobacco use disorder      -reviewed labs with pt -continue statin and  lowfat diet -pt encouraged on his efforts to stop smoking.   Will rx albuterol mdi -Increase creon to 2 capsules with breakfast, lunch, supper and 1 with evening snack. -Add albuterol mdi and cont atorvastatin.  Need to change address to [facility] if possible due to pt no longer has address to receive his meds/mail -pt to follow up in 6 weeks to check weight.  He is to contact office sooner prn

## 2022-03-28 ENCOUNTER — Ambulatory Visit: Payer: Self-pay | Admitting: Physician Assistant

## 2022-04-11 ENCOUNTER — Ambulatory Visit: Payer: Self-pay | Admitting: Physician Assistant

## 2022-04-18 ENCOUNTER — Ambulatory Visit: Payer: Self-pay | Admitting: Physician Assistant

## 2022-04-18 ENCOUNTER — Encounter: Payer: Self-pay | Admitting: Physician Assistant

## 2022-04-18 VITALS — BP 102/74 | HR 98 | Temp 97.8°F | Ht 68.0 in | Wt 120.6 lb

## 2022-04-18 DIAGNOSIS — Z5900 Homelessness unspecified: Secondary | ICD-10-CM

## 2022-04-18 DIAGNOSIS — E785 Hyperlipidemia, unspecified: Secondary | ICD-10-CM

## 2022-04-18 DIAGNOSIS — Z79899 Other long term (current) drug therapy: Secondary | ICD-10-CM

## 2022-04-18 DIAGNOSIS — F17200 Nicotine dependence, unspecified, uncomplicated: Secondary | ICD-10-CM

## 2022-04-18 DIAGNOSIS — K8681 Exocrine pancreatic insufficiency: Secondary | ICD-10-CM

## 2022-04-18 MED ORDER — ATORVASTATIN CALCIUM 20 MG PO TABS
20.0000 mg | ORAL_TABLET | Freq: Every day | ORAL | 0 refills | Status: DC
Start: 2022-04-18 — End: 2023-09-10

## 2022-04-18 NOTE — Progress Notes (Signed)
BP 102/74   Pulse 98   Temp 97.8 F (36.6 C)   Ht 5\' 8"  (1.727 m)   Wt 120 lb 9.6 oz (54.7 kg)   SpO2 100%   BMI 18.34 kg/m    Subjective:    Patient ID: Eddie Morrow, male    DOB: [birth year], 51 y.o.   MRN: [id number]  HPI: Eddie Morrow is a 51 y.o. male presenting on 04/18/2022 for No chief complaint on file.   HPI   Pt says he is doing better on the creon.  He admits he hasn't been eating enough lately  He is still working part-time at [employer] but he says his house have been cut back  [place].  He says last night he fell asleep watching netflix on his hotspot.  He is real proud of his smart watch he bought on the internet.  Marland Kitchen He continues to smoke.     Relevant past medical, surgical, family and social history reviewed and updated as indicated. Interim medical history since our last visit reviewed. Allergies and medications reviewed and updated.   Current Outpatient Medications:    acetaminophen (TYLENOL) 500 MG tablet, Take 1,000 mg by mouth every 6 (six) hours as needed for mild pain., Disp: , Rfl:    atorvastatin (LIPITOR) 20 MG tablet, Take 1 tablet (20 mg total) by mouth daily., Disp: 90 tablet, Rfl: 1   Ensure Max Protein (ENSURE MAX PROTEIN) LIQD, Take 330 mLs (11 oz total) by mouth 3 (three) times daily between meals., Disp: , Rfl:    Glucerna (GLUCERNA) LIQD, Take 237 mLs by mouth., Disp: , Rfl:    Pancrelipase, Lip-Prot-Amyl, (CREON) 24000-76000 units CPEP, 2 capsules PO with breakfast, lunch and supper and 1 capsule PO with evening snack, Disp: 630 capsule, Rfl: 0   simethicone (MYLICON) 80 MG chewable tablet, Chew 80 mg by mouth every 6 (six) hours as needed for flatulence., Disp: , Rfl:    Review of Systems  Per HPI unless specifically indicated above     Objective:    BP 102/74   Pulse 98   Temp 97.8 F (36.6 C)   Ht 5\' 8"  (1.727 m)   Wt 120 lb 9.6 oz (54.7 kg)   SpO2 100%   BMI 18.34 kg/m   Wt Readings from Last 3  Encounters:  04/18/22 120 lb 9.6 oz (54.7 kg)  02/14/22 123 lb (55.8 kg)  12/13/21 124 lb 9.6 oz (56.5 kg)    Physical Exam Vitals reviewed.  Constitutional:      General: He is not in acute distress.    Appearance: He is not ill-appearing.  HENT:     Head: Normocephalic and atraumatic.  Cardiovascular:     Rate and Rhythm: Normal rate and regular rhythm.  Pulmonary:     Effort: Pulmonary effort is normal.     Breath sounds: Normal breath sounds. No wheezing.  Abdominal:     General: Bowel sounds are normal.     Palpations: Abdomen is soft.     Tenderness: There is no abdominal tenderness.  Musculoskeletal:     Cervical back: Neck supple.     Right lower leg: No edema.     Left lower leg: No edema.  Lymphadenopathy:     Cervical: No cervical adenopathy.  Skin:    General: Skin is warm and dry.  Neurological:     Mental Status: He is alert and oriented to person, place, and time.  Psychiatric:  Behavior: Behavior normal.           Assessment & Plan:    Encounter Diagnoses  Name Primary?   Exocrine pancreatic insufficiency Yes   Hyperlipidemia, unspecified hyperlipidemia type    Tobacco use disorder    On statin therapy    Homeless      -will Update labs.  Will call results -pt to Cont current rx -pt was encouraged to eat regularly  -pt to follow up  3 months.  He is to contact office sooner prn

## 2022-04-28 IMAGING — US US ABDOMEN LIMITED
1 series · 14 of 25 positions shown · non-contrast
Comparison: CT 06/04/2020

CLINICAL DATA: Concern for gallstone history of distal
pancreatectomy wedge gastrectomy and splenectomy

EXAM:
ULTRASOUND ABDOMEN LIMITED RIGHT UPPER QUADRANT

[Series 1: us abdomen limited ruq (liver/gb) · 14 of 50 slices shown]
[im 1/50]
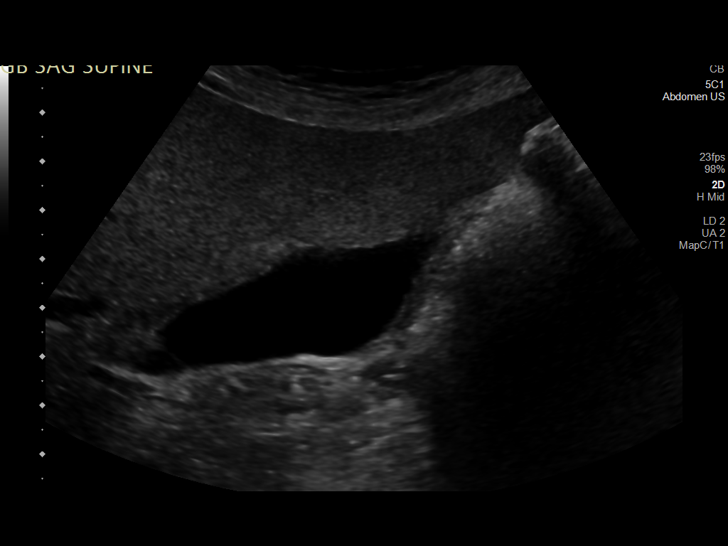
[im 5/50]
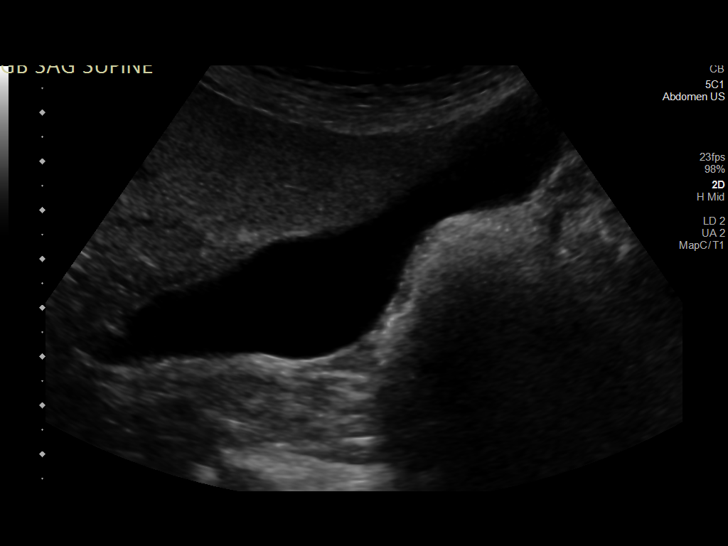
[im 9/50]
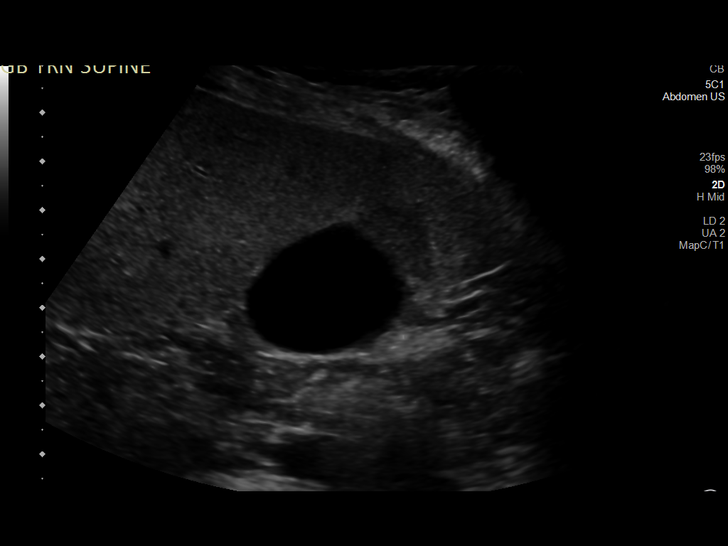
[im 13/50]
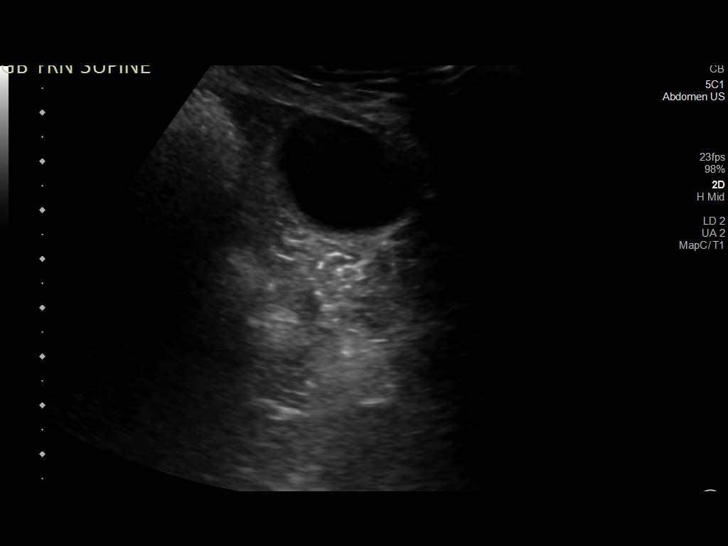
[im 17/50]
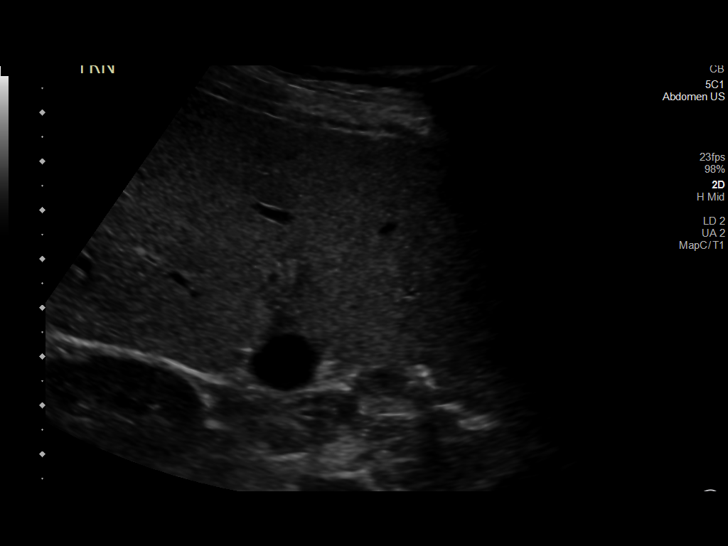
[im 19/50]
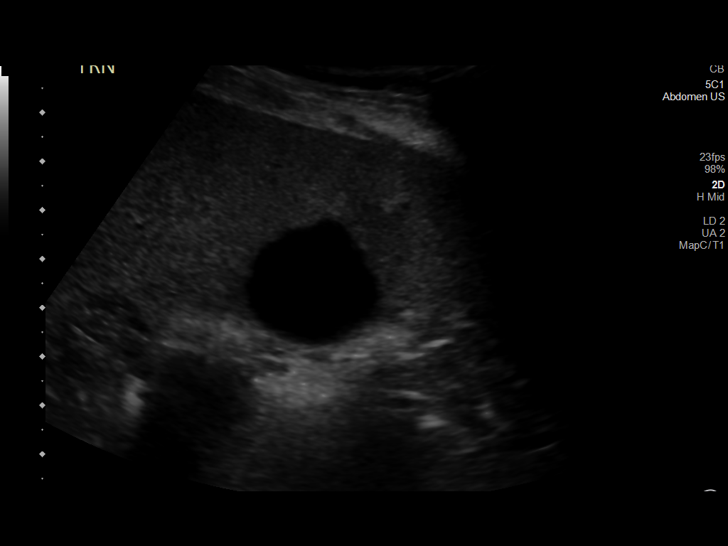
[im 23/50]
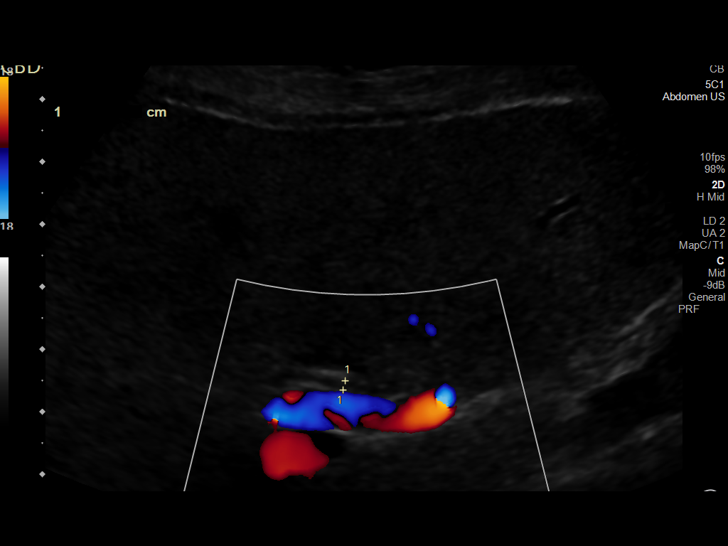
[im 27/50]
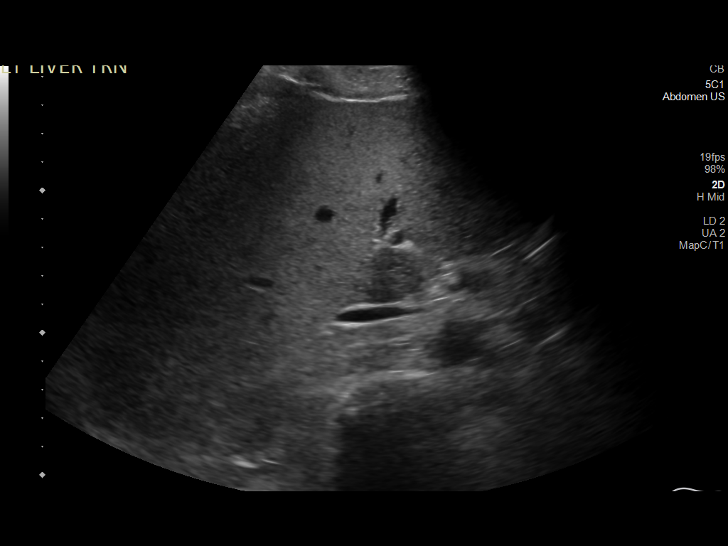
[im 31/50]
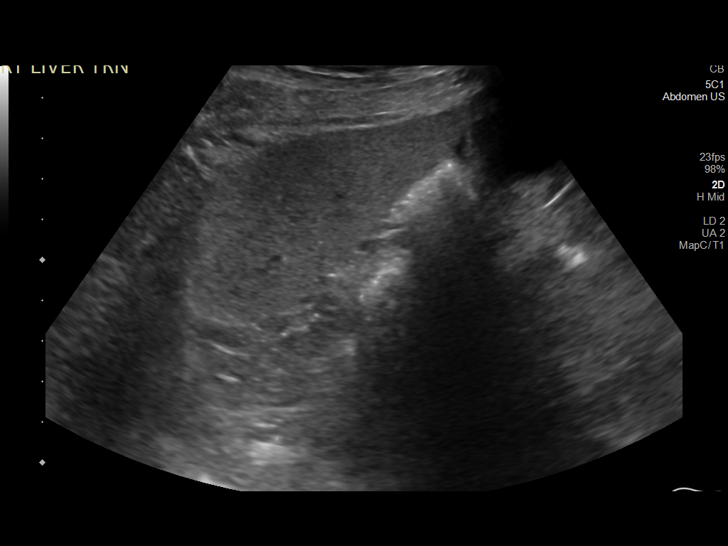
[im 33/50]
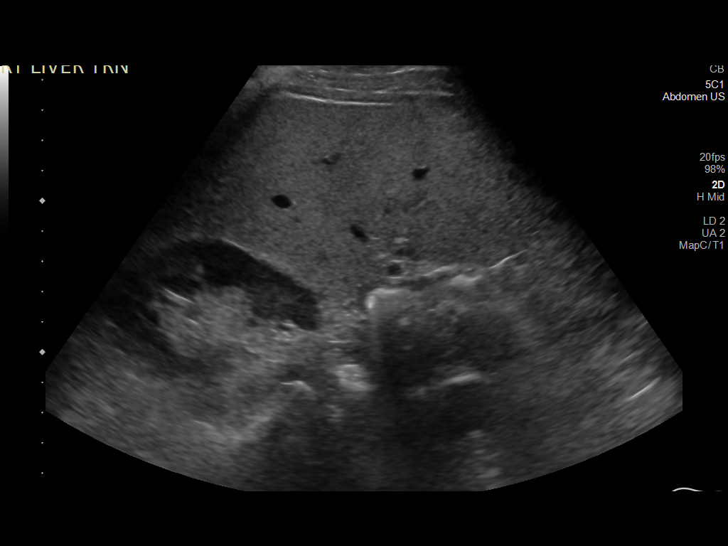
[im 37/50]
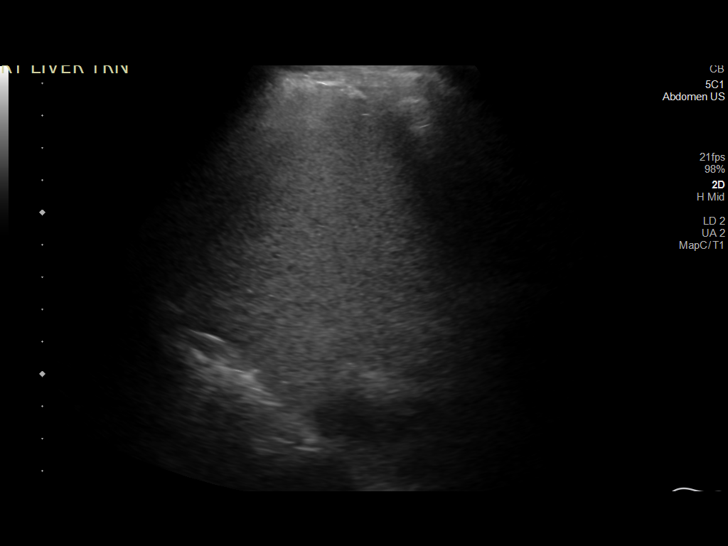
[im 41/50]
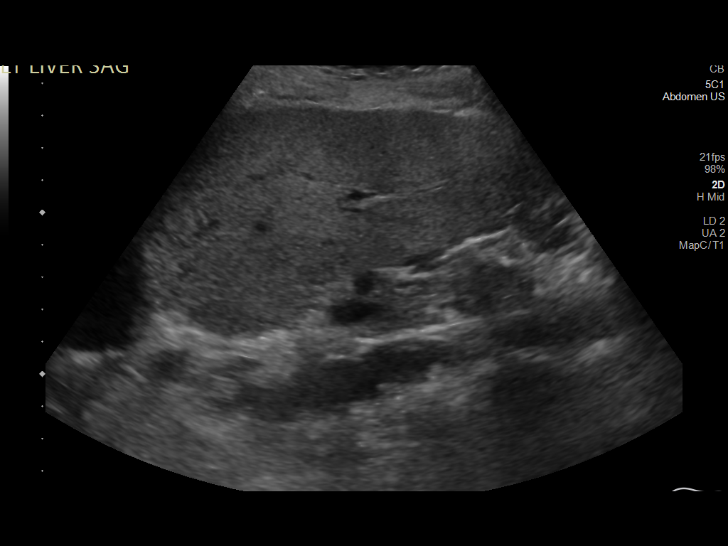
[im 45/50]
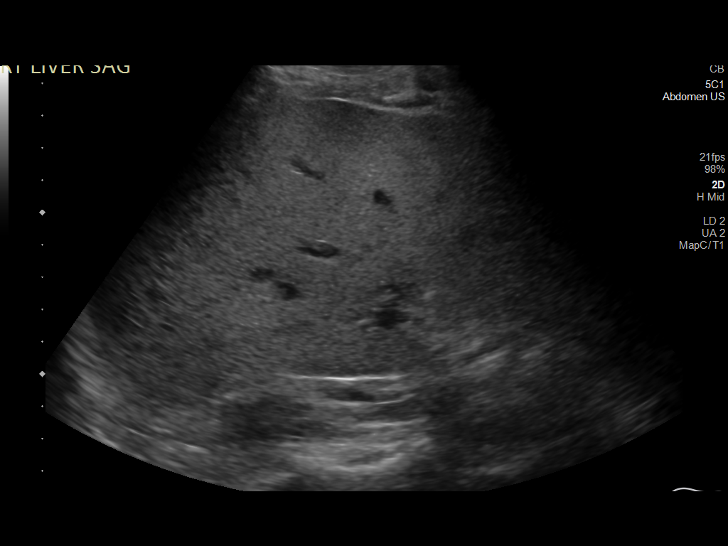
[im 50/50]
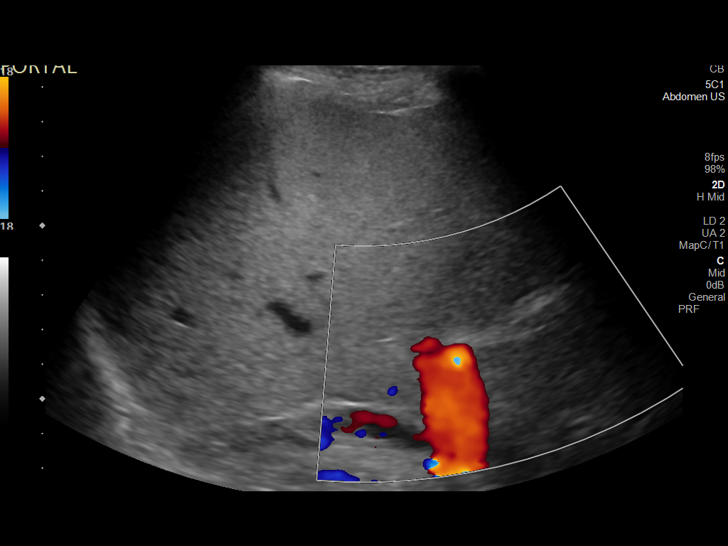

[14 of 25 positions shown; findings below may reference images not displayed]

FINDINGS: Gallbladder:

No gallstones or wall thickening visualized. No sonographic Murphy
sign noted by sonographer.

Common bile duct:

Diameter: 1.5 mm

Liver:

Liver appears slightly echogenic. No focal hepatic abnormality.
Portal vein is patent on color Doppler imaging with normal direction
of blood flow towards the liver.

Other: Small amount of free fluid adjacent to the liver.
IMPRESSION: 1. Negative for gallstones.
2. Slightly echogenic liver as may be seen with steatosis.

## 2022-07-25 ENCOUNTER — Ambulatory Visit: Payer: Self-pay | Admitting: Physician Assistant

## 2022-11-06 ENCOUNTER — Encounter: Payer: Self-pay | Admitting: Internal Medicine

## 2022-11-06 ENCOUNTER — Ambulatory Visit (INDEPENDENT_AMBULATORY_CARE_PROVIDER_SITE_OTHER): Payer: Medicaid Other | Admitting: Internal Medicine

## 2022-11-06 VITALS — BP 148/104 | HR 94 | Resp 16 | Ht 70.0 in | Wt 122.1 lb

## 2022-11-06 DIAGNOSIS — F1090 Alcohol use, unspecified, uncomplicated: Secondary | ICD-10-CM

## 2022-11-06 DIAGNOSIS — I10 Essential (primary) hypertension: Secondary | ICD-10-CM

## 2022-11-06 DIAGNOSIS — G621 Alcoholic polyneuropathy: Secondary | ICD-10-CM | POA: Diagnosis not present

## 2022-11-06 DIAGNOSIS — E785 Hyperlipidemia, unspecified: Secondary | ICD-10-CM

## 2022-11-06 DIAGNOSIS — K8681 Exocrine pancreatic insufficiency: Secondary | ICD-10-CM

## 2022-11-06 MED ORDER — AMLODIPINE BESYLATE 5 MG PO TABS: 5.0000 mg | ORAL_TABLET | Freq: Every day | ORAL | 0 refills | Status: DC

## 2022-11-06 MED ORDER — CREON 24000-76000 UNITS PO CPEP: ORAL_CAPSULE | ORAL | 2 refills | Status: DC

## 2022-11-06 MED ORDER — ACETAMINOPHEN 500 MG PO TABS: 1000.0000 mg | ORAL_TABLET | Freq: Three times a day (TID) | ORAL | 5 refills | Status: AC | PRN

## 2022-11-06 NOTE — Patient Instructions (Addendum)
Thank you, [name] for allowing [place] to provide your care today.   Refills sent -     amLODIPine Besylate; Take 1 tablet (5 mg total) by mouth daily.  Dispense: 60 tablet; Refill: 0 -     Creon; 2 capsules PO with breakfast, lunch and supper and 1 capsule PO with evening snack  Dispense: 630 capsule; Refill: 2 -     Acetaminophen; Take 2 tablets (1,000 mg total) by mouth every 8 (eight) hours as needed for mild pain.  Dispense: 30 tablet; Refill: 5  Labs ordered -     CMP14+EGFR -     CBC with Differential/Platelet -     PT/INR     Reminders: Follow up in one month for blood pressure check.     Thurmon Fair, M.D.

## 2022-11-06 NOTE — Progress Notes (Unsigned)
    HPI:Eddie Morrow is a 52 y.o. male with past medical history of HLD, HTN, and hx of necrotic pancreatic mass s/p partial pancreatomy who presents to establish care, and has concern for foot pain/numbness. For the details of today's visit, please refer to the assessment and plan.   Past Medical History:  Diagnosis Date   Hyperlipidemia    Hypertension     Past Surgical History:  Procedure Laterality Date   SPLENECTOMY, TOTAL N/A 06/04/2020   Procedure: SPLENECTOMY, PARTIAL GASTRECTOMY, DISTAL PANCREATECTOMY, GASTRO-JEJUNO TUBE PLACEMENT;  Surgeon: Quentin Ore, MD;  Location: [facility] OR;  Service: General;  Laterality: N/A;    History reviewed. No pertinent family history.  Social History   Tobacco Use   Smoking status: Every Day    Packs/day: .5    Types: Cigarettes   Smokeless tobacco: Never  Vaping Use   Vaping Use: Never used  Substance Use Topics   Alcohol use: Yes    Alcohol/week: 7.0 standard drinks of alcohol    Types: 7 Shots of liquor per week   Drug use: Yes    Types: Marijuana    Comment: last use april 2023      Physical Exam: Vitals:   11/06/22 1105 11/06/22 1115  BP: (!) 152/104 (!) 148/104  Pulse: 94   Resp: 16   SpO2: 92%   Weight: 122 lb 1.9 oz (55.4 kg)   Height: 5\' 10"  (1.778 m)      Physical Exam Constitutional:      General: Eddie Morrow is not in acute distress.    Appearance: Eddie Morrow is cachectic.  HENT:     Mouth/Throat:     Dentition: Abnormal dentition.  Cardiovascular:     Rate and Rhythm: Normal rate and regular rhythm.     Heart sounds: No murmur heard. Pulmonary:     Effort: Pulmonary effort is normal.     Breath sounds: No wheezing or rales.  Musculoskeletal:     Right lower leg: No edema.     Left lower leg: No edema.  Psychiatric:        Behavior: Behavior is cooperative.      Assessment & Plan:   Exocrine pancreatic insufficiency Creon refilled  Alcoholic polyneuropathy (HCC) Reviewed previous workup with  normal ABI Suspect patient has alcoholic polyneuropathy.  Recommend no alcohol use.   HTN (hypertension) Not currently on antihypertensive Patient's BP today is 148/104 with a goal of <140/80. .  Start Amlodipine 5 mg Follow up in 1 month for BP check  HLD (hyperlipidemia) LDL 72 on review of lab from 01/2022 Continue atorvastatin 20 mg  Alcohol use disorder Alcohol use disorder Check CMP, CBC, and PT/INR Recommend to stop drinking alcohol  Will check CMP. Can discuss Naltrexone at follow up .     Milus Banister, MD

## 2022-11-07 DIAGNOSIS — G621 Alcoholic polyneuropathy: Secondary | ICD-10-CM | POA: Insufficient documentation

## 2022-11-07 DIAGNOSIS — E785 Hyperlipidemia, unspecified: Secondary | ICD-10-CM | POA: Insufficient documentation

## 2022-11-07 DIAGNOSIS — F1090 Alcohol use, unspecified, uncomplicated: Secondary | ICD-10-CM | POA: Insufficient documentation

## 2022-11-07 DIAGNOSIS — I10 Essential (primary) hypertension: Secondary | ICD-10-CM | POA: Insufficient documentation

## 2022-11-07 NOTE — Assessment & Plan Note (Signed)
Alcohol use disorder Check CMP, CBC, and PT/INR Recommend to stop drinking alcohol  Will check CMP. Can discuss Naltrexone at follow up .

## 2022-11-07 NOTE — Assessment & Plan Note (Signed)
Not currently on antihypertensive Patient's BP today is 148/104 with a goal of <140/80. .  Start Amlodipine 5 mg Follow up in 1 month for BP check

## 2022-11-07 NOTE — Assessment & Plan Note (Signed)
LDL 72 on review of lab from 01/2022 Continue atorvastatin 20 mg

## 2022-11-07 NOTE — Assessment & Plan Note (Signed)
Creon refilled.

## 2022-11-07 NOTE — Assessment & Plan Note (Signed)
Reviewed previous workup with normal ABI Suspect patient has alcoholic polyneuropathy.  Recommend no alcohol use.

## 2022-12-07 ENCOUNTER — Ambulatory Visit: Payer: Medicaid Other | Admitting: Internal Medicine

## 2023-05-27 ENCOUNTER — Ambulatory Visit: Payer: Medicaid Other | Admitting: Internal Medicine

## 2023-06-05 ENCOUNTER — Encounter: Payer: Self-pay | Admitting: Internal Medicine

## 2023-07-26 ENCOUNTER — Ambulatory Visit: Payer: Self-pay | Admitting: Family Medicine

## 2023-07-30 ENCOUNTER — Telehealth: Payer: Self-pay

## 2023-07-30 NOTE — Telephone Encounter (Signed)
 Copied from CRM [id number]. Topic: Clinical - Medication Question >> Jul 30, 2023 11:25 AM Powell HERO wrote: Reason for CRM: Patient is having severe congestion and is wanting to know if he can have prescription sent over to help his congestion. No other symptoms just fevers.

## 2023-07-30 NOTE — Telephone Encounter (Signed)
LVM for pt to call back for 4:40 appt tomorrow

## 2023-09-10 ENCOUNTER — Ambulatory Visit: Payer: Medicaid Other | Admitting: Internal Medicine

## 2023-09-10 ENCOUNTER — Encounter: Payer: Self-pay | Admitting: Internal Medicine

## 2023-09-10 VITALS — BP 132/87 | HR 114 | Ht 70.0 in | Wt 133.4 lb

## 2023-09-10 DIAGNOSIS — G621 Alcoholic polyneuropathy: Secondary | ICD-10-CM

## 2023-09-10 DIAGNOSIS — Z1211 Encounter for screening for malignant neoplasm of colon: Secondary | ICD-10-CM

## 2023-09-10 DIAGNOSIS — K8681 Exocrine pancreatic insufficiency: Secondary | ICD-10-CM

## 2023-09-10 DIAGNOSIS — E785 Hyperlipidemia, unspecified: Secondary | ICD-10-CM

## 2023-09-10 DIAGNOSIS — I10 Essential (primary) hypertension: Secondary | ICD-10-CM

## 2023-09-10 DIAGNOSIS — Z9081 Acquired absence of spleen: Secondary | ICD-10-CM

## 2023-09-10 DIAGNOSIS — Z1329 Encounter for screening for other suspected endocrine disorder: Secondary | ICD-10-CM

## 2023-09-10 DIAGNOSIS — Z1159 Encounter for screening for other viral diseases: Secondary | ICD-10-CM

## 2023-09-10 DIAGNOSIS — Z1321 Encounter for screening for nutritional disorder: Secondary | ICD-10-CM

## 2023-09-10 MED ORDER — CREON 24000-76000 UNITS PO CPEP: ORAL_CAPSULE | ORAL | 2 refills | Status: AC

## 2023-09-10 MED ORDER — ATORVASTATIN CALCIUM 20 MG PO TABS
20.0000 mg | ORAL_TABLET | Freq: Every day | ORAL | 3 refills | Status: AC
Start: 2023-09-10 — End: ?

## 2023-09-10 MED ORDER — GABAPENTIN 100 MG PO CAPS: 100.0000 mg | ORAL_CAPSULE | Freq: Every day | ORAL | 3 refills | Status: DC

## 2023-09-10 MED ORDER — AMLODIPINE BESYLATE 5 MG PO TABS: 5.0000 mg | ORAL_TABLET | Freq: Every day | ORAL | 3 refills | Status: DC

## 2023-09-10 NOTE — Patient Instructions (Signed)
 It was a pleasure to see you today.  Thank you for giving [place] the opportunity to be involved in your care.  Below is a brief recap of your visit and next steps.  We will plan to see you again in 4-6 weeks.  Summary Medications refilled Try gabapentin nightly for neuropathy Update labs Follow up in 4-6 weeks for reassessment

## 2023-09-10 NOTE — Progress Notes (Signed)
 Established Patient Office Visit  Subjective   Patient ID: Eddie Morrow, male    DOB: 22-Aug-1970  Age: 53 y.o. MRN: [id number]  Chief Complaint  Patient presents with   Care Management    Pt wants to talk about medications.    Eddie Morrow returns to care today for follow-up.  Eddie Morrow was last evaluated in May 2024 by Dr. Barbaraann Faster as a new patient presenting to establish care.  Amlodipine was started in the setting of hypertension, his additional medications were refilled, repeat labs ordered, and one month follow up arranged.  There have been no acute interval events. Today Eddie Morrow reports feeling poorly largely due to numbness / tingling in his legs. His gait is impaired because of discomfort. Eddie Morrow is not currently working and Is planning to file for disability. Eddie Morrow does not have any acute concerns to discuss today.   Past Medical History:  Diagnosis Date   Hyperlipidemia    Hypertension    Past Surgical History:  Procedure Laterality Date   SPLENECTOMY, TOTAL N/A 06/04/2020   Procedure: SPLENECTOMY, PARTIAL GASTRECTOMY, DISTAL PANCREATECTOMY, GASTRO-JEJUNO TUBE PLACEMENT;  Surgeon: Quentin Ore, MD;  Location: [facility] OR;  Service: General;  Laterality: N/A;   Social History   Tobacco Use   Smoking status: Every Day    Current packs/day: 0.50    Types: Cigarettes   Smokeless tobacco: Never  Vaping Use   Vaping status: Never Used  Substance Use Topics   Alcohol use: Yes    Alcohol/week: 7.0 standard drinks of alcohol    Types: 7 Shots of liquor per week   Drug use: Yes    Types: Marijuana    Comment: last use april 2023   History reviewed. No pertinent family history. Allergies  Allergen Reactions   Lactose Intolerance (Gi) Diarrhea    Also Flatulence   Review of Systems  Neurological:  Positive for tingling (lower extremities).     Objective:     BP 132/87 (BP Location: Right Arm, Patient Position: Sitting, Cuff Size: Normal)   Pulse (!) 114   Ht 5\' 10"  (1.778 m)    Wt 133 lb 6.4 oz (60.5 kg)   SpO2 98%   BMI 19.14 kg/m  BP Readings from Last 3 Encounters:  09/10/23 132/87  11/06/22 (!) 148/104  04/18/22 102/74   Physical Exam Vitals reviewed.  Constitutional:      General: Eddie Morrow is not in acute distress.    Appearance: Normal appearance. Eddie Morrow is not ill-appearing.  HENT:     Head: Normocephalic and atraumatic.     Right Ear: External ear normal.     Left Ear: External ear normal.     Nose: Nose normal. No congestion or rhinorrhea.     Mouth/Throat:     Mouth: Mucous membranes are moist.     Pharynx: Oropharynx is clear.  Eyes:     General: No scleral icterus.    Extraocular Movements: Extraocular movements intact.     Conjunctiva/sclera: Conjunctivae normal.     Pupils: Pupils are equal, round, and reactive to light.  Cardiovascular:     Rate and Rhythm: Normal rate and regular rhythm.     Pulses: Normal pulses.     Heart sounds: Normal heart sounds. No murmur heard. Pulmonary:     Effort: Pulmonary effort is normal.     Breath sounds: Normal breath sounds. No wheezing, rhonchi or rales.  Abdominal:     General: Abdomen is flat. Bowel sounds are normal. There is no  distension.     Palpations: Abdomen is soft.     Tenderness: There is no abdominal tenderness.  Musculoskeletal:        General: No swelling or deformity. Normal range of motion.     Cervical back: Normal range of motion.  Skin:    General: Skin is warm and dry.     Capillary Refill: Capillary refill takes less than 2 seconds.  Neurological:     General: No focal deficit present.     Mental Status: Eddie Morrow is alert and oriented to person, place, and time.     Motor: No weakness.     Gait: Gait abnormal (ambulates with a cane).  Psychiatric:        Mood and Affect: Mood normal.        Behavior: Behavior normal.        Thought Content: Thought content normal.   Last CBC Lab Results  Component Value Date   WBC 7.2 10/18/2021   HGB 15.9 10/18/2021   HCT 47.1 10/18/2021    MCV 101.7 (H) 10/18/2021   MCH 34.3 (H) 10/18/2021   RDW 15.9 (H) 10/18/2021   PLT 285 10/18/2021   Last metabolic panel Lab Results  Component Value Date   GLUCOSE 100 (H) 10/18/2021   NA 135 10/18/2021   K 4.8 10/18/2021   CL 101 10/18/2021   CO2 28 10/18/2021   BUN 13 10/18/2021   CREATININE 0.73 10/18/2021   GFRNONAA >60 10/18/2021   CALCIUM 9.6 10/18/2021   PROT 7.9 02/07/2022   ALBUMIN 4.3 02/07/2022   BILITOT 0.7 02/07/2022   ALKPHOS 91 02/07/2022   AST 54 (H) 02/07/2022   ALT 37 02/07/2022   ANIONGAP 6 10/18/2021   Last lipids Lab Results  Component Value Date   CHOL 167 02/07/2022   HDL 75 02/07/2022   LDLCALC 72 02/07/2022   TRIG 100 02/07/2022   CHOLHDL 2.2 02/07/2022   Last hemoglobin A1c Lab Results  Component Value Date   HGBA1C 5.4 10/18/2021   The 10-year ASCVD risk score (Arnett DK, et al., 2019) is: 5.3%    Assessment & Plan:   Problem List Items Addressed This Visit       HTN (hypertension) - Primary   Amlodipine 5 mg daily was started in May 2024 in the setting of hypertension.  Eddie Morrow is out of medication currently.  BP today is 132/87. -Refill and resume amlodipine 5 mg daily      Exocrine pancreatic insufficiency   Creon refilled today      Alcoholic polyneuropathy (HCC)   Eddie Morrow displays impaired gait and endorses numbness/tingling in both lower extremities.  Eddie Morrow has previously completed ABIs, which were within normal limits.  Alcoholic polyneuropathy is suspected due to chronic alcohol use.  Eddie Morrow continues to drink alcohol but has significantly reduced the frequency of consumption.  Eddie Morrow reports that Eddie Morrow is currently unable to work because of lower extremity discomfort and impaired gait.  Eddie Morrow is planning to file for disability. -Complete cessation from alcohol was again encouraged -Trial gabapentin 100 mg nightly for symptom relief -Will refer to neurology for further neuropathy workup and nerve conduction testing      HLD (hyperlipidemia)    Most recently prescribed atorvastatin 20 mg daily.  Refill provided today.  Repeat lipid panel ordered.      Colon cancer screening   Cologuard ordered today      Return in about 4 weeks (around 10/08/2023) for htn, lab review, neuropathy.   Lucina Mellow  Durwin Nora, MD

## 2023-09-23 DIAGNOSIS — Z1211 Encounter for screening for malignant neoplasm of colon: Secondary | ICD-10-CM | POA: Insufficient documentation

## 2023-09-23 NOTE — Assessment & Plan Note (Signed)
 Most recently prescribed atorvastatin 20 mg daily.  Refill provided today.  Repeat lipid panel ordered.

## 2023-09-23 NOTE — Assessment & Plan Note (Signed)
 Cologuard ordered today

## 2023-09-23 NOTE — Assessment & Plan Note (Signed)
 He displays impaired gait and endorses numbness/tingling in both lower extremities.  He has previously completed ABIs, which were within normal limits.  Alcoholic polyneuropathy is suspected due to chronic alcohol use.  He continues to drink alcohol but has significantly reduced the frequency of consumption.  He reports that he is currently unable to work because of lower extremity discomfort and impaired gait.  He is planning to file for disability. -Complete cessation from alcohol was again encouraged -Trial gabapentin 100 mg nightly for symptom relief -Will refer to neurology for further neuropathy workup and nerve conduction testing

## 2023-09-23 NOTE — Assessment & Plan Note (Signed)
 Amlodipine 5 mg daily was started in May 2024 in the setting of hypertension.  He is out of medication currently.  BP today is 132/87. -Refill and resume amlodipine 5 mg daily

## 2023-09-23 NOTE — Assessment & Plan Note (Signed)
Creon refilled today

## 2023-10-28 ENCOUNTER — Ambulatory Visit: Admitting: Internal Medicine

## 2023-10-31 ENCOUNTER — Ambulatory Visit: Admitting: Internal Medicine

## 2023-11-21 ENCOUNTER — Encounter: Payer: Self-pay | Admitting: Internal Medicine

## 2023-11-21 ENCOUNTER — Ambulatory Visit: Admitting: Internal Medicine

## 2023-11-21 VITALS — BP 118/78 | HR 78 | Ht 70.5 in | Wt 135.8 lb

## 2023-11-21 DIAGNOSIS — E785 Hyperlipidemia, unspecified: Secondary | ICD-10-CM

## 2023-11-21 DIAGNOSIS — G621 Alcoholic polyneuropathy: Secondary | ICD-10-CM | POA: Diagnosis not present

## 2023-11-21 DIAGNOSIS — I10 Essential (primary) hypertension: Secondary | ICD-10-CM

## 2023-11-21 MED ORDER — AMLODIPINE BESYLATE 5 MG PO TABS: 5.0000 mg | ORAL_TABLET | Freq: Every day | ORAL | 3 refills | Status: AC

## 2023-11-21 MED ORDER — GABAPENTIN 100 MG PO CAPS: 100.0000 mg | ORAL_CAPSULE | Freq: Two times a day (BID) | ORAL | 3 refills | Status: DC

## 2023-11-21 NOTE — Patient Instructions (Signed)
 It was a pleasure to see you today.  Thank you for giving us  the opportunity to be involved in your care.  Below is a brief recap of your visit and next steps.  We will plan to see you again in 3 months.  Summary Increase frequency of gabapentin  to 100 mg twice daily Complete labs when able Follow up in 3 months

## 2023-11-21 NOTE — Progress Notes (Signed)
 Established Patient Office Visit  Subjective   Patient ID: Eddie Morrow, male    DOB: 07-01-1970  Age: 53 y.o. MRN: [id number]  Chief Complaint  Patient presents with   Hypertension    Follow up    Eddie Morrow returns to care today for follow-up.  He was last evaluated by me on 3/18.  Medications were refilled at that time, repeat labs ordered, and 4-week follow-up arranged for reassessment.  He endorsed numbness/tingling in both legs and displayed an impaired gait.  Alcoholic polyneuropathy is suspected in the setting of chronic alcohol abuse.  Gabapentin  100 mg nightly was trialed for symptom relief and he was referred to neurology for further neuropathy workup and nerve conduction testing.  There have been no acute interval events.  Today he reports feeling well.  Lower extremity discomfort has improved with starting gabapentin .  He has not completed repeat labs due to a lack of funding.  He does not have any additional concerns to discuss today.  Past Medical History:  Diagnosis Date   Hyperlipidemia    Hypertension    Past Surgical History:  Procedure Laterality Date   SPLENECTOMY, TOTAL N/A 06/04/2020   Procedure: SPLENECTOMY, PARTIAL GASTRECTOMY, DISTAL PANCREATECTOMY, GASTRO-JEJUNO TUBE PLACEMENT;  Surgeon: Eddie Olds, MD;  Location: [facility] OR;  Service: General;  Laterality: N/A;   Social History   Tobacco Use   Smoking status: Every Day    Current packs/day: 0.50    Types: Cigarettes   Smokeless tobacco: Never  Vaping Use   Vaping status: Never Used  Substance Use Topics   Alcohol use: Yes    Alcohol/week: 7.0 standard drinks of alcohol    Types: 7 Shots of liquor per week   Drug use: Yes    Types: Marijuana    Comment: last use april 2023   History reviewed. No pertinent family history. Allergies  Allergen Reactions   Lactose Intolerance (Gi) Diarrhea    Also Flatulence   Review of Systems  Constitutional:  Negative for chills and fever.  HENT:   Negative for sore throat.   Respiratory:  Negative for cough and shortness of breath.   Cardiovascular:  Negative for chest pain, palpitations and leg swelling.  Gastrointestinal:  Negative for abdominal pain, blood in stool, constipation, diarrhea, nausea and vomiting.  Genitourinary:  Negative for dysuria and hematuria.  Musculoskeletal:  Negative for myalgias.  Skin:  Negative for itching and rash.  Neurological:  Positive for tingling (lower extremities). Negative for dizziness and headaches.  Psychiatric/Behavioral:  Negative for depression and suicidal ideas.      Objective:     BP 118/78   Pulse 78   Ht 5' 10.5" (1.791 m)   Wt 135 lb 12.8 oz (61.6 kg)   SpO2 95%   BMI 19.21 kg/m  BP Readings from Last 3 Encounters:  11/21/23 118/78  09/10/23 132/87  11/06/22 (!) 148/104   Physical Exam Vitals reviewed.  Constitutional:      General: He is not in acute distress.    Appearance: Normal appearance. He is not ill-appearing.  HENT:     Head: Normocephalic and atraumatic.     Right Ear: External ear normal.     Left Ear: External ear normal.     Nose: Nose normal. No congestion or rhinorrhea.     Mouth/Throat:     Mouth: Mucous membranes are moist.     Pharynx: Oropharynx is clear.  Eyes:     General: No scleral icterus.  Extraocular Movements: Extraocular movements intact.     Conjunctiva/sclera: Conjunctivae normal.     Pupils: Pupils are equal, round, and reactive to light.  Cardiovascular:     Rate and Rhythm: Normal rate and regular rhythm.     Pulses: Normal pulses.     Heart sounds: Normal heart sounds. No murmur heard. Pulmonary:     Effort: Pulmonary effort is normal.     Breath sounds: Normal breath sounds. No wheezing, rhonchi or rales.  Abdominal:     General: Abdomen is flat. Bowel sounds are normal. There is no distension.     Palpations: Abdomen is soft.     Tenderness: There is no abdominal tenderness.  Musculoskeletal:        General: No  swelling or deformity. Normal range of motion.     Cervical back: Normal range of motion.  Skin:    General: Skin is warm and dry.     Capillary Refill: Capillary refill takes less than 2 seconds.  Neurological:     General: No focal deficit present.     Mental Status: He is alert and oriented to person, place, and time.     Motor: No weakness.     Gait: Gait abnormal (ambulates with a cane).  Psychiatric:        Mood and Affect: Mood normal.        Behavior: Behavior normal.        Thought Content: Thought content normal.   Last CBC Lab Results  Component Value Date   WBC 7.2 10/18/2021   HGB 15.9 10/18/2021   HCT 47.1 10/18/2021   MCV 101.7 (H) 10/18/2021   MCH 34.3 (H) 10/18/2021   RDW 15.9 (H) 10/18/2021   PLT 285 10/18/2021   Last metabolic panel Lab Results  Component Value Date   GLUCOSE 100 (H) 10/18/2021   NA 135 10/18/2021   K 4.8 10/18/2021   CL 101 10/18/2021   CO2 28 10/18/2021   BUN 13 10/18/2021   CREATININE 0.73 10/18/2021   GFRNONAA >60 10/18/2021   CALCIUM  9.6 10/18/2021   PROT 7.9 02/07/2022   ALBUMIN  4.3 02/07/2022   BILITOT 0.7 02/07/2022   ALKPHOS 91 02/07/2022   AST 54 (H) 02/07/2022   ALT 37 02/07/2022   ANIONGAP 6 10/18/2021   Last lipids Lab Results  Component Value Date   CHOL 167 02/07/2022   HDL 75 02/07/2022   LDLCALC 72 02/07/2022   TRIG 100 02/07/2022   CHOLHDL 2.2 02/07/2022   Last hemoglobin A1c Lab Results  Component Value Date   HGBA1C 5.4 10/18/2021   The 10-year ASCVD risk score (Arnett DK, et al., 2019) is: 4.3%    Assessment & Plan:   Problem List Items Addressed This Visit       HTN (hypertension) - Primary   Adequately controlled with amlodipine  5 mg daily.  No medication changes are indicated today.      Alcoholic polyneuropathy (HCC)   Gabapentin  100 mg nightly was prescribed at his last appointment in the setting of suspected alcoholic polyneuropathy.  He was also referred to neurology but has not  been able to be reached when contacted to schedule an appointment.  He states that gabapentin  has been effective.  It now feels as though he is walking on "BBs instead of glass".  Symptoms typically wear off in the early afternoon.  He previously expressed apprehension about taking higher doses of gabapentin .  Through shared decision making, we will increase the frequency of  gabapentin  100 mg twice daily.  Will follow-up on previously placed referral to neurology.      Return in about 3 months (around 02/21/2024).   Tobi Fortes, MD

## 2023-11-21 NOTE — Assessment & Plan Note (Signed)
Adequately controlled with amlodipine 5 mg daily.  No medication changes are indicated today.

## 2023-11-21 NOTE — Assessment & Plan Note (Signed)
 Gabapentin  100 mg nightly was prescribed at his last appointment in the setting of suspected alcoholic polyneuropathy.  He was also referred to neurology but has not been able to be reached when contacted to schedule an appointment.  He states that gabapentin  has been effective.  It now feels as though he is walking on "BBs instead of glass".  Symptoms typically wear off in the early afternoon.  He previously expressed apprehension about taking higher doses of gabapentin .  Through shared decision making, we will increase the frequency of gabapentin  100 mg twice daily.  Will follow-up on previously placed referral to neurology.

## 2024-01-13 NOTE — Congregational Nurse Program (Signed)
 Completed follow up nurse case management as related to lack of sociodeterminant needs

## 2024-02-10 ENCOUNTER — Other Ambulatory Visit: Payer: Self-pay

## 2024-02-10 DIAGNOSIS — G621 Alcoholic polyneuropathy: Secondary | ICD-10-CM

## 2024-02-10 MED ORDER — GABAPENTIN 100 MG PO CAPS: 100.0000 mg | ORAL_CAPSULE | Freq: Two times a day (BID) | ORAL | 1 refills | Status: DC

## 2024-02-10 NOTE — Telephone Encounter (Signed)
 Copied from CRM [id number]. Topic: Clinical - Medication Refill >> Feb 10, 2024 12:08 PM Kevelyn M wrote: Medication: gabapentin  (NEURONTIN ) 100 MG capsule, Patient needs the prescription dosage on prescription to change. He said he last doctor told him to take 5 day. 2 for breakfast, 2 for lunch and then 1 at night.. He's asking for 300mg  pill  Has the patient contacted their pharmacy? Yes (Agent: If no, request that the patient contact the pharmacy for the refill. If patient does not wish to contact the pharmacy document the reason why and proceed with request.) (Agent: If yes, when and what did the pharmacy advise?)  This is the patient's preferred pharmacy:  [facility] [phone] - [place], [place] - [address] AT [facility] [address] [place] [address] Phone: [phone] Fax: [phone]  Is this the correct pharmacy for this prescription? Yes If no, delete pharmacy and type the correct one.   Has the prescription been filled recently? No  Is the patient out of the medication? No  Has the patient been seen for an appointment in the last year OR does the patient have an upcoming appointment? Yes  Can we respond through MyChart? Yes  Agent: Please be advised that Rx refills may take up to 3 business days. We ask that you follow-up with your pharmacy.

## 2024-02-21 ENCOUNTER — Ambulatory Visit

## 2024-02-21 DIAGNOSIS — G621 Alcoholic polyneuropathy: Secondary | ICD-10-CM | POA: Diagnosis not present

## 2024-02-21 MED ORDER — GABAPENTIN 300 MG PO CAPS: 300.0000 mg | ORAL_CAPSULE | Freq: Two times a day (BID) | ORAL | 3 refills | Status: AC

## 2024-02-21 NOTE — Progress Notes (Signed)
 Established Patient Office Visit  Subjective   Patient ID: Eddie Morrow, male    DOB: [birth year]  Age: 53 y.o. MRN: [id number]  Chief Complaint  Patient presents with   Medical Management of Chronic Issues    3 month follow up    HPI Discussed the use of AI scribe software for clinical note transcription with the patient, who gave verbal consent to proceed.  History of Present Illness   Eddie Morrow is a 53 year old male who presents for a regular three-month follow-up and requests a change in his gabapentin  dosage.  Neuropathic pain and balance disturbance - Gabapentin  100 mg dose does not provide sufficient relief for neuropathic pain and balance issues. - Gabapentin  300 mg dose, obtained from a friend, provides more effective symptom control. - Requires support to maintain balance in open spaces. - Requests increase in gabapentin  to 300 mg twice daily; does not require nighttime dose as his wife helps prevent leg twitching at night.  Chronic pain management - Takes 500 mg acetaminophen  at night for pain relief. - Uses pain medication in the morning with food and in the evening to aid sleep.  Hypertension - Takes amlodipine  in the evening with a pain pill to help with sleep.  Hyperlipidemia - Takes atorvastatin  in the morning with food, along with Creon , pain pills, and gabapentin .  Pancreatic enzyme replacement therapy - Takes Creon  in the morning with food.  Nutritional deficiency and weight loss - Significant unintentional weight loss from 134.6 pounds to 101 pounds. - Poor oral intake since Saturday night, only consuming Slim Jims and a honey bun. - Has not had a proper meal due to stress and sleeping at the hospital.  Psychosocial stress - Experiencing significant stress due to wife's recent seizure, fall, aneurysm, and current unresponsive state post-surgery. - Sleeping at the hospital and not eating well. - Receiving food assistance from [employer].      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  Constitutional:      Appearance: Normal appearance.  HENT:     Head: Normocephalic.  Cardiovascular:     Rate and Rhythm: Normal rate and regular rhythm.  Pulmonary:     Effort: Pulmonary effort is normal.     Breath sounds: Normal breath sounds.  Musculoskeletal:     Cervical back: Normal range of motion and neck supple.  Neurological:     Mental Status: He is alert and oriented to person, place, and time.  Psychiatric:        Mood and Affect: Mood  normal.        Thought Content: Thought content normal.     No results found for any visits on 02/21/24.    The 10-year ASCVD risk score (Arnett DK, et al., 2019) is: 4.1%    Assessment & Plan:   Problem List Items Addressed This Visit       Nervous and Auditory   Alcoholic polyneuropathy (HCC)   Relevant Medications   gabapentin  (NEURONTIN ) 300 MG capsule         Return in about 6 months (around 08/22/2024) for chronic follow-up with PCP.    Leita Longs, FNP

## 2024-03-19 ENCOUNTER — Ambulatory Visit: Payer: MEDICAID | Admitting: Neurology

## 2024-03-19 ENCOUNTER — Encounter: Payer: Self-pay | Admitting: Neurology

## 2024-03-19 VITALS — BP 121/84 | HR 88 | Ht 70.0 in | Wt 136.5 lb

## 2024-03-19 DIAGNOSIS — R269 Unspecified abnormalities of gait and mobility: Secondary | ICD-10-CM

## 2024-03-19 DIAGNOSIS — R270 Ataxia, unspecified: Secondary | ICD-10-CM | POA: Insufficient documentation

## 2024-03-19 DIAGNOSIS — G629 Polyneuropathy, unspecified: Secondary | ICD-10-CM | POA: Insufficient documentation

## 2024-03-19 DIAGNOSIS — G6289 Other specified polyneuropathies: Secondary | ICD-10-CM

## 2024-03-19 DIAGNOSIS — M792 Neuralgia and neuritis, unspecified: Secondary | ICD-10-CM

## 2024-03-19 MED ORDER — ALPRAZOLAM 1 MG PO TABS: ORAL_TABLET | ORAL | 0 refills | Status: AC

## 2024-03-19 NOTE — Progress Notes (Signed)
 Chief Complaint  Patient presents with   RM15/Alcholic Polyneuropathy    Pt is here Alone. Pt states that he has numbness and tingling, but more in his right foot. Pt states that when he walks it feels like walking on broken glass. Pt states when takes Gabapentin  it feels like bebe pellets in his feet.       ASSESSMENT AND PLAN  Eddie Morrow is a 53 y.o. male   Ataxic gait Neuropathic pain  Following his significant GI issue in December 2021, necrotic pancreatic mass, ruptured spleen, require distal pancreatectomy, splenectomy, partial gastrectomy, also required a G-tube postsurgically for a while,  Long history of heavy alcohol abuse, quit since then  Examination showed significant lower extremity dysmetria, truncal ataxia, consistent with cerebellum malfunction, also has mild length-dependent sensory changes, most suggestive of small fiber neuropathy  Laboratory evaluation to rule out treatable etiology  MRI of brain  Follow-up will depend on above workup result  DIAGNOSTIC DATA (LABS, IMAGING, TESTING) - I reviewed patient records, labs, notes, testing and imaging myself where available.   MEDICAL HISTORY:  Eddie Morrow, is a 53 year old male seen in request by his primary care from [facility] Dr. Melvenia, [place] for evaluation of gait abnormality, bilateral lower extremity paresthesia, initial evaluation March 19, 2024   History is obtained from the patient and review of electronic medical records. I personally reviewed pertinent available imaging films in PACS.   PMHx of  HTN HLD Smoke 0.5 PPD Alcohol abuse, quit in 2023, Hx of splenectomy, distal pancreatectomy, partial gastrectomy on Jun 04 2020.  He used to work at [employer], long history of alcohol abuse, hospital admission in December 2021, for acute abdominal pain, had a ruptured spleen, acute blood loss anemia, required blood transfusion, he underwent splenectomy, partial wedge gastrectomy, distal  pancreatectomy, found to have necrotic pancreatic mass, ruptured spleen, postsurgical, he required G-tube, he eventually recovered, was able to discharge home  Ever since then, he noticed the gait abnormality, he has stopped heavy alcohol use since then, but his gait has not improved, also complains of numbness tingling at bilateral feet, as if walking on broken glasses, taking gabapentin  300 mg twice a day as needed, it does help his symptoms, make bearing weight tolerable,  He is still able to drive, walk with a walker, he is under a lot of stress, wife is at hospital for stroke  PHYSICAL EXAM:   Vitals:   03/19/24 1411  BP: 121/84  Pulse: 88  Weight: 136 lb 8 oz (61.9 kg)  Height: 5' 10 (1.778 m)   Body mass index is 19.59 kg/m.  PHYSICAL EXAMNIATION:  Gen: NAD, conversant, well nourised, well groomed                     Cardiovascular: Regular rate rhythm, no peripheral edema, warm, nontender. Eyes: Conjunctivae clear without exudates or hemorrhage Neck: Supple, no carotid bruits. Pulmonary: Clear to auscultation bilaterally   NEUROLOGICAL EXAM:  MENTAL STATUS: Speech/cognition: Awake, alert, oriented to history taking and casual conversation CRANIAL NERVES: CN II: Visual fields are full to confrontation. Pupils are round equal and briskly reactive to light. CN III, IV, VI: extraocular movement are normal. No ptosis. CN V: Facial sensation is intact to light touch CN VII: Face is symmetric with normal eye closure  CN VIII: Hearing is normal to causal conversation. CN IX, X: Phonation is normal. CN XI: Head turning and shoulder shrug are intact  MOTOR: There is no pronator drift of  out-stretched arms. Muscle bulk and tone are normal. Muscle strength is normal.  REFLEXES: Reflexes are 2+ and symmetric at the biceps, triceps, knees, and trace at ankles. Plantar responses are flexor.  SENSORY: Mildly decreased vibratory sensation pinprick at toes  COORDINATION:  Moderate truncal ataxia, no significant finger-to-nose dysmetria, profound heel-to-shin dysmetria, right worse than left   GAIT/STANCE: Need push-up to get up from seated position, wide-based unsteady  REVIEW OF SYSTEMS:  Full 14 system review of systems performed and notable only for as above All other review of systems were negative.   ALLERGIES: Allergies  Allergen Reactions   Lactose Intolerance (Gi) Diarrhea    Also Flatulence    HOME MEDICATIONS: Current Outpatient Medications  Medication Sig Dispense Refill   acetaminophen  (TYLENOL ) 500 MG tablet Take 2 tablets (1,000 mg total) by mouth every 8 (eight) hours as needed for mild pain. 30 tablet 5   amLODipine  (NORVASC ) 5 MG tablet Take 1 tablet (5 mg total) by mouth daily. 90 tablet 3   atorvastatin  (LIPITOR) 20 MG tablet Take 1 tablet (20 mg total) by mouth daily. 90 tablet 3   Ensure Max Protein (ENSURE MAX PROTEIN) LIQD Take 330 mLs (11 oz total) by mouth 3 (three) times daily between meals.     gabapentin  (NEURONTIN ) 300 MG capsule Take 1 capsule (300 mg total) by mouth 2 (two) times daily. 180 capsule 3   Pancrelipase , Lip-Prot-Amyl, (CREON ) 24000-76000 units CPEP 2 capsules PO with breakfast, lunch and supper and 1 capsule PO with evening snack 630 capsule 2   No current facility-administered medications for this visit.    PAST MEDICAL HISTORY: Past Medical History:  Diagnosis Date   Hyperlipidemia    Hypertension     PAST SURGICAL HISTORY: Past Surgical History:  Procedure Laterality Date   SPLENECTOMY, TOTAL N/A 06/04/2020   Procedure: SPLENECTOMY, PARTIAL GASTRECTOMY, DISTAL PANCREATECTOMY, GASTRO-JEJUNO TUBE PLACEMENT;  Surgeon: Lyndel Deward PARAS, MD;  Location: [facility] OR;  Service: General;  Laterality: N/A;    FAMILY HISTORY: History reviewed. No pertinent family history.  SOCIAL HISTORY: Social History   Socioeconomic History   Marital status: Married    Spouse name: Not on file   Number of  children: Not on file   Years of education: Not on file   Highest education level: GED or equivalent  Occupational History   Not on file  Tobacco Use   Smoking status: Every Day    Current packs/day: 0.50    Types: Cigarettes   Smokeless tobacco: Never  Vaping Use   Vaping status: Never Used  Substance and Sexual Activity   Alcohol use: Yes    Alcohol/week: 7.0 standard drinks of alcohol    Types: 7 Shots of liquor per week   Drug use: Yes    Types: Marijuana    Comment: last use april 2023   Sexual activity: Yes    Partners: Female  Other Topics Concern   Not on file  Social History Narrative   Not on file   Social Drivers of Health   Financial Resource Strain: High Risk (02/20/2024)   Overall Financial Resource Strain (CARDIA)    Difficulty of Paying Living Expenses: Very hard  Food Insecurity: Food Insecurity Present (02/20/2024)   Hunger Vital Sign    Worried About Running Out of Food in the Last Year: Often true    Ran Out of Food in the Last Year: Often true  Transportation Needs: No Transportation Needs (02/20/2024)   PRAPARE - Transportation  Lack of Transportation (Medical): No    Lack of Transportation (Non-Medical): No  Physical Activity: Inactive (02/20/2024)   Exercise Vital Sign    Days of Exercise per Week: 0 days    Minutes of Exercise per Session: Not on file  Stress: Stress Concern Present (02/20/2024)   [employer] of Occupational Health - Occupational Stress Questionnaire    Feeling of Stress: Rather much  Social Connections: Moderately Isolated (02/20/2024)   Social Connection and Isolation Panel    Frequency of Communication with Friends and Family: Three times a week    Frequency of Social Gatherings with Friends and Family: Twice a week    Attends Religious Services: Never    Database administrator or Organizations: No    Attends Engineer, structural: Not on file    Marital Status: Married  Catering manager Violence: Not on file       Modena Callander, M.D. Ph.D.  [facility] [address], [address] [place], [place] [address] Ph: [phone] Fax: [phone]  CC:  Melvenia Manus BRAVO, MD [address] [place],  [place] [address]  Bevely Doffing, FNP

## 2024-03-23 ENCOUNTER — Telehealth: Payer: Self-pay | Admitting: Neurology

## 2024-03-23 LAB — CBC WITH DIFFERENTIAL/PLATELET
Basophils Absolute: 0.1 x10E3/uL (ref 0.0–0.2)
Basos: 1 %
EOS (ABSOLUTE): 0.2 x10E3/uL (ref 0.0–0.4)
Eos: 3 %
Hematocrit: 46 % (ref 37.5–51.0)
Hemoglobin: 15.8 g/dL (ref 13.0–17.7)
Immature Grans (Abs): 0.1 x10E3/uL (ref 0.0–0.1)
Immature Granulocytes: 1 %
Lymphocytes Absolute: 3.5 x10E3/uL — ABNORMAL HIGH (ref 0.7–3.1)
Lymphs: 38 %
MCH: 34.8 pg — ABNORMAL HIGH (ref 26.6–33.0)
MCHC: 34.3 g/dL (ref 31.5–35.7)
MCV: 101 fL — ABNORMAL HIGH (ref 79–97)
Monocytes Absolute: 1.1 x10E3/uL — ABNORMAL HIGH (ref 0.1–0.9)
Monocytes: 12 %
Neutrophils Absolute: 4.2 x10E3/uL (ref 1.4–7.0)
Neutrophils: 45 %
Platelets: 309 x10E3/uL (ref 150–450)
RBC: 4.54 x10E6/uL (ref 4.14–5.80)
RDW: 13.5 % (ref 11.6–15.4)
WBC: 9.3 x10E3/uL (ref 3.4–10.8)

## 2024-03-23 LAB — MULTIPLE MYELOMA PANEL, SERUM
Albumin SerPl Elph-Mcnc: 3.5 g/dL (ref 2.9–4.4)
Albumin/Glob SerPl: 1.1 (ref 0.7–1.7)
Alpha 1: 0.3 g/dL (ref 0.0–0.4)
Alpha2 Glob SerPl Elph-Mcnc: 0.9 g/dL (ref 0.4–1.0)
B-Globulin SerPl Elph-Mcnc: 1.5 g/dL — ABNORMAL HIGH (ref 0.7–1.3)
Gamma Glob SerPl Elph-Mcnc: 0.7 g/dL (ref 0.4–1.8)
Globulin, Total: 3.5 g/dL (ref 2.2–3.9)
IgA/Immunoglobulin A, Serum: 493 mg/dL — ABNORMAL HIGH (ref 90–386)
IgG (Immunoglobin G), Serum: 984 mg/dL (ref 603–1613)
IgM (Immunoglobulin M), Srm: 63 mg/dL (ref 20–172)

## 2024-03-23 LAB — SEDIMENTATION RATE: Sed Rate: 30 mm/h (ref 0–30)

## 2024-03-23 LAB — COMPREHENSIVE METABOLIC PANEL WITH GFR
ALT: 21 IU/L (ref 0–44)
AST: 33 IU/L (ref 0–40)
Albumin: 4.1 g/dL (ref 3.8–4.9)
Alkaline Phosphatase: 98 IU/L (ref 47–123)
BUN/Creatinine Ratio: 18 (ref 9–20)
BUN: 11 mg/dL (ref 6–24)
Bilirubin Total: <0.2 mg/dL (ref 0.0–1.2)
CO2: 20 mmol/L (ref 20–29)
Calcium: 9.5 mg/dL (ref 8.7–10.2)
Chloride: 101 mmol/L (ref 96–106)
Creatinine, Ser: 0.6 mg/dL — ABNORMAL LOW (ref 0.76–1.27)
Globulin, Total: 2.9 g/dL (ref 1.5–4.5)
Glucose: 93 mg/dL (ref 70–99)
Potassium: 4.3 mmol/L (ref 3.5–5.2)
Sodium: 138 mmol/L (ref 134–144)
Total Protein: 7 g/dL (ref 6.0–8.5)
eGFR: 117 mL/min/1.73 (ref >59)

## 2024-03-23 LAB — RPR: RPR Ser Ql: NONREACTIVE

## 2024-03-23 LAB — VITAMIN B12: Vitamin B-12: 477 pg/mL (ref 232–1245)

## 2024-03-23 LAB — FOLATE: Folate: 3.5 ng/mL (ref >3.0)

## 2024-03-23 LAB — FERRITIN: Ferritin: 204 ng/mL (ref 30–400)

## 2024-03-23 LAB — TSH: TSH: 1.03 u[IU]/mL (ref 0.450–4.500)

## 2024-03-23 LAB — HGB A1C W/O EAG: Hgb A1c MFr Bld: 5.6 % (ref 4.8–5.6)

## 2024-03-23 LAB — CK: Total CK: 146 U/L (ref 41–331)

## 2024-03-23 LAB — C-REACTIVE PROTEIN: CRP: 12 mg/L — ABNORMAL HIGH (ref 0–10)

## 2024-03-23 LAB — ANA W/REFLEX IF POSITIVE: Anti Nuclear Antibody (ANA): NEGATIVE

## 2024-03-23 LAB — HIV ANTIBODY (ROUTINE TESTING W REFLEX): HIV Screen 4th Generation wRfx: NONREACTIVE

## 2024-03-23 LAB — VITAMIN B1: Thiamine: 79.2 nmol/L (ref 66.5–200.0)

## 2024-03-23 NOTE — Telephone Encounter (Signed)
 [facility]: [id number] exp. 03/23/24-04/22/24 sent to Zelda Salmon [phone]

## 2024-03-24 ENCOUNTER — Ambulatory Visit: Payer: Self-pay | Admitting: Neurology

## 2024-07-06 ENCOUNTER — Telehealth: Payer: Self-pay

## 2024-07-06 NOTE — Telephone Encounter (Signed)
 [facility] Forms Noted Copied Scanned Original in provider box Copy at front desk

## 2024-07-06 NOTE — Telephone Encounter (Signed)
 Forms completed and given to the front

## 2024-07-06 NOTE — Telephone Encounter (Signed)
 In provider office

## 2024-07-06 NOTE — Telephone Encounter (Signed)
 Called patient will pick up the form.

## 2024-07-08 NOTE — Telephone Encounter (Signed)
Patient pick up form

## 2024-08-20 ENCOUNTER — Ambulatory Visit
